# Patient Record
Sex: Female | Born: 1980 | Race: White | Hispanic: No | Marital: Married | Smoking: Former smoker
Health system: Southern US, Community
[De-identification: ages and names within clinical notes are randomized; demographics above are authoritative.]

## PROBLEM LIST (undated history)

## (undated) DIAGNOSIS — G43909 Migraine, unspecified, not intractable, without status migrainosus: Secondary | ICD-10-CM

## (undated) DIAGNOSIS — M359 Systemic involvement of connective tissue, unspecified: Secondary | ICD-10-CM

## (undated) DIAGNOSIS — F3289 Other specified depressive episodes: Secondary | ICD-10-CM

## (undated) DIAGNOSIS — T7840XA Allergy, unspecified, initial encounter: Secondary | ICD-10-CM

## (undated) DIAGNOSIS — H269 Unspecified cataract: Secondary | ICD-10-CM

## (undated) DIAGNOSIS — F419 Anxiety disorder, unspecified: Secondary | ICD-10-CM

## (undated) DIAGNOSIS — M1990 Unspecified osteoarthritis, unspecified site: Secondary | ICD-10-CM

## (undated) DIAGNOSIS — J309 Allergic rhinitis, unspecified: Secondary | ICD-10-CM

## (undated) DIAGNOSIS — M069 Rheumatoid arthritis, unspecified: Secondary | ICD-10-CM

## (undated) DIAGNOSIS — M26609 Unspecified temporomandibular joint disorder, unspecified side: Secondary | ICD-10-CM

## (undated) DIAGNOSIS — J45909 Unspecified asthma, uncomplicated: Secondary | ICD-10-CM

## (undated) DIAGNOSIS — G4733 Obstructive sleep apnea (adult) (pediatric): Secondary | ICD-10-CM

## (undated) DIAGNOSIS — F329 Major depressive disorder, single episode, unspecified: Secondary | ICD-10-CM

## (undated) HISTORY — DX: Allergy, unspecified, initial encounter: T78.40XA

## (undated) HISTORY — DX: Obstructive sleep apnea (adult) (pediatric): G47.33

## (undated) HISTORY — DX: Anxiety disorder, unspecified: F41.9

## (undated) HISTORY — DX: Systemic involvement of connective tissue, unspecified: M35.9

## (undated) HISTORY — DX: Unspecified temporomandibular joint disorder, unspecified side: M26.609

## (undated) HISTORY — DX: Allergic rhinitis, unspecified: J30.9

## (undated) HISTORY — DX: Unspecified asthma, uncomplicated: J45.909

## (undated) HISTORY — DX: Unspecified cataract: H26.9

## (undated) HISTORY — PX: HEMORRHOID SURGERY: SHX153

## (undated) HISTORY — DX: Migraine, unspecified, not intractable, without status migrainosus: G43.909

## (undated) HISTORY — DX: Rheumatoid arthritis, unspecified: M06.9

## (undated) HISTORY — DX: Other specified depressive episodes: F32.89

## (undated) HISTORY — DX: Unspecified osteoarthritis, unspecified site: M19.90

## (undated) HISTORY — DX: Major depressive disorder, single episode, unspecified: F32.9

---
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
## 2008-06-16 ENCOUNTER — Emergency Department (HOSPITAL_COMMUNITY): Admission: EM | Admit: 2008-06-16 | Discharge: 2008-06-16 | Payer: Self-pay | Admitting: Emergency Medicine

## 2008-07-14 ENCOUNTER — Encounter: Admission: RE | Admit: 2008-07-14 | Discharge: 2008-07-14 | Payer: Self-pay | Admitting: Obstetrics and Gynecology

## 2008-07-21 ENCOUNTER — Encounter: Admission: RE | Admit: 2008-07-21 | Discharge: 2008-07-21 | Payer: Self-pay | Admitting: Obstetrics and Gynecology

## 2008-09-10 ENCOUNTER — Ambulatory Visit (HOSPITAL_COMMUNITY): Admission: RE | Admit: 2008-09-10 | Discharge: 2008-09-10 | Payer: Self-pay | Admitting: Rheumatology

## 2008-10-11 ENCOUNTER — Ambulatory Visit: Payer: Self-pay | Admitting: Internal Medicine

## 2008-10-11 DIAGNOSIS — R519 Headache, unspecified: Secondary | ICD-10-CM | POA: Insufficient documentation

## 2008-10-11 DIAGNOSIS — Z87448 Personal history of other diseases of urinary system: Secondary | ICD-10-CM | POA: Insufficient documentation

## 2008-10-11 DIAGNOSIS — B373 Candidiasis of vulva and vagina: Secondary | ICD-10-CM

## 2008-10-11 DIAGNOSIS — J309 Allergic rhinitis, unspecified: Secondary | ICD-10-CM | POA: Insufficient documentation

## 2008-10-11 DIAGNOSIS — B351 Tinea unguium: Secondary | ICD-10-CM

## 2008-10-11 DIAGNOSIS — R51 Headache: Secondary | ICD-10-CM

## 2008-10-11 DIAGNOSIS — M129 Arthropathy, unspecified: Secondary | ICD-10-CM | POA: Insufficient documentation

## 2008-10-11 DIAGNOSIS — G43909 Migraine, unspecified, not intractable, without status migrainosus: Secondary | ICD-10-CM

## 2008-10-11 DIAGNOSIS — M359 Systemic involvement of connective tissue, unspecified: Secondary | ICD-10-CM

## 2008-10-11 DIAGNOSIS — J45909 Unspecified asthma, uncomplicated: Secondary | ICD-10-CM | POA: Insufficient documentation

## 2008-10-11 DIAGNOSIS — F329 Major depressive disorder, single episode, unspecified: Secondary | ICD-10-CM

## 2008-10-22 ENCOUNTER — Ambulatory Visit (HOSPITAL_COMMUNITY): Admission: RE | Admit: 2008-10-22 | Discharge: 2008-10-22 | Payer: Self-pay | Admitting: Rheumatology

## 2008-11-11 ENCOUNTER — Ambulatory Visit: Payer: Self-pay | Admitting: Internal Medicine

## 2008-11-11 DIAGNOSIS — N390 Urinary tract infection, site not specified: Secondary | ICD-10-CM

## 2008-11-11 LAB — CONVERTED CEMR LAB
Bilirubin Urine: NEGATIVE
Nitrite: NEGATIVE
Protein, U semiquant: NEGATIVE
Specific Gravity, Urine: 1.025
pH: 5

## 2008-11-12 ENCOUNTER — Encounter: Payer: Self-pay | Admitting: Internal Medicine

## 2008-11-16 ENCOUNTER — Encounter: Payer: Self-pay | Admitting: Internal Medicine

## 2008-12-03 ENCOUNTER — Encounter: Payer: Self-pay | Admitting: Internal Medicine

## 2008-12-07 ENCOUNTER — Ambulatory Visit: Payer: Self-pay | Admitting: Internal Medicine

## 2008-12-07 LAB — CONVERTED CEMR LAB
Bilirubin Urine: NEGATIVE
Nitrite: NEGATIVE
Protein, U semiquant: NEGATIVE
Specific Gravity, Urine: <1.005
WBC Urine, dipstick: NEGATIVE

## 2008-12-08 ENCOUNTER — Encounter: Payer: Self-pay | Admitting: Internal Medicine

## 2009-01-11 ENCOUNTER — Ambulatory Visit: Payer: Self-pay | Admitting: Internal Medicine

## 2009-01-14 ENCOUNTER — Encounter: Payer: Self-pay | Admitting: Internal Medicine

## 2009-01-26 ENCOUNTER — Encounter: Payer: Self-pay | Admitting: Internal Medicine

## 2009-02-08 ENCOUNTER — Ambulatory Visit: Payer: Self-pay | Admitting: Internal Medicine

## 2009-02-23 ENCOUNTER — Ambulatory Visit: Payer: Self-pay | Admitting: Internal Medicine

## 2009-03-26 ENCOUNTER — Telehealth: Payer: Self-pay | Admitting: Family Medicine

## 2009-03-29 ENCOUNTER — Ambulatory Visit: Payer: Self-pay | Admitting: Internal Medicine

## 2009-04-08 ENCOUNTER — Encounter: Payer: Self-pay | Admitting: Internal Medicine

## 2009-08-11 ENCOUNTER — Encounter: Payer: Self-pay | Admitting: Internal Medicine

## 2009-08-26 ENCOUNTER — Encounter: Payer: Self-pay | Admitting: Internal Medicine

## 2009-08-30 ENCOUNTER — Ambulatory Visit: Payer: Self-pay | Admitting: Internal Medicine

## 2009-08-30 LAB — CONVERTED CEMR LAB
Bilirubin Urine: NEGATIVE
Blood in Urine, dipstick: NEGATIVE
Protein, U semiquant: 30
Specific Gravity, Urine: >=1.03
Urobilinogen, UA: 0.2

## 2010-01-28 IMAGING — CR DG CHEST 2V
2 series · 2 of 2 positions shown · non-contrast
Comparison: None available.

CLINICAL DATA: Pre-procedure examination.  Question tuberculosis.

CHEST - 2 VIEW

[w chest pa]
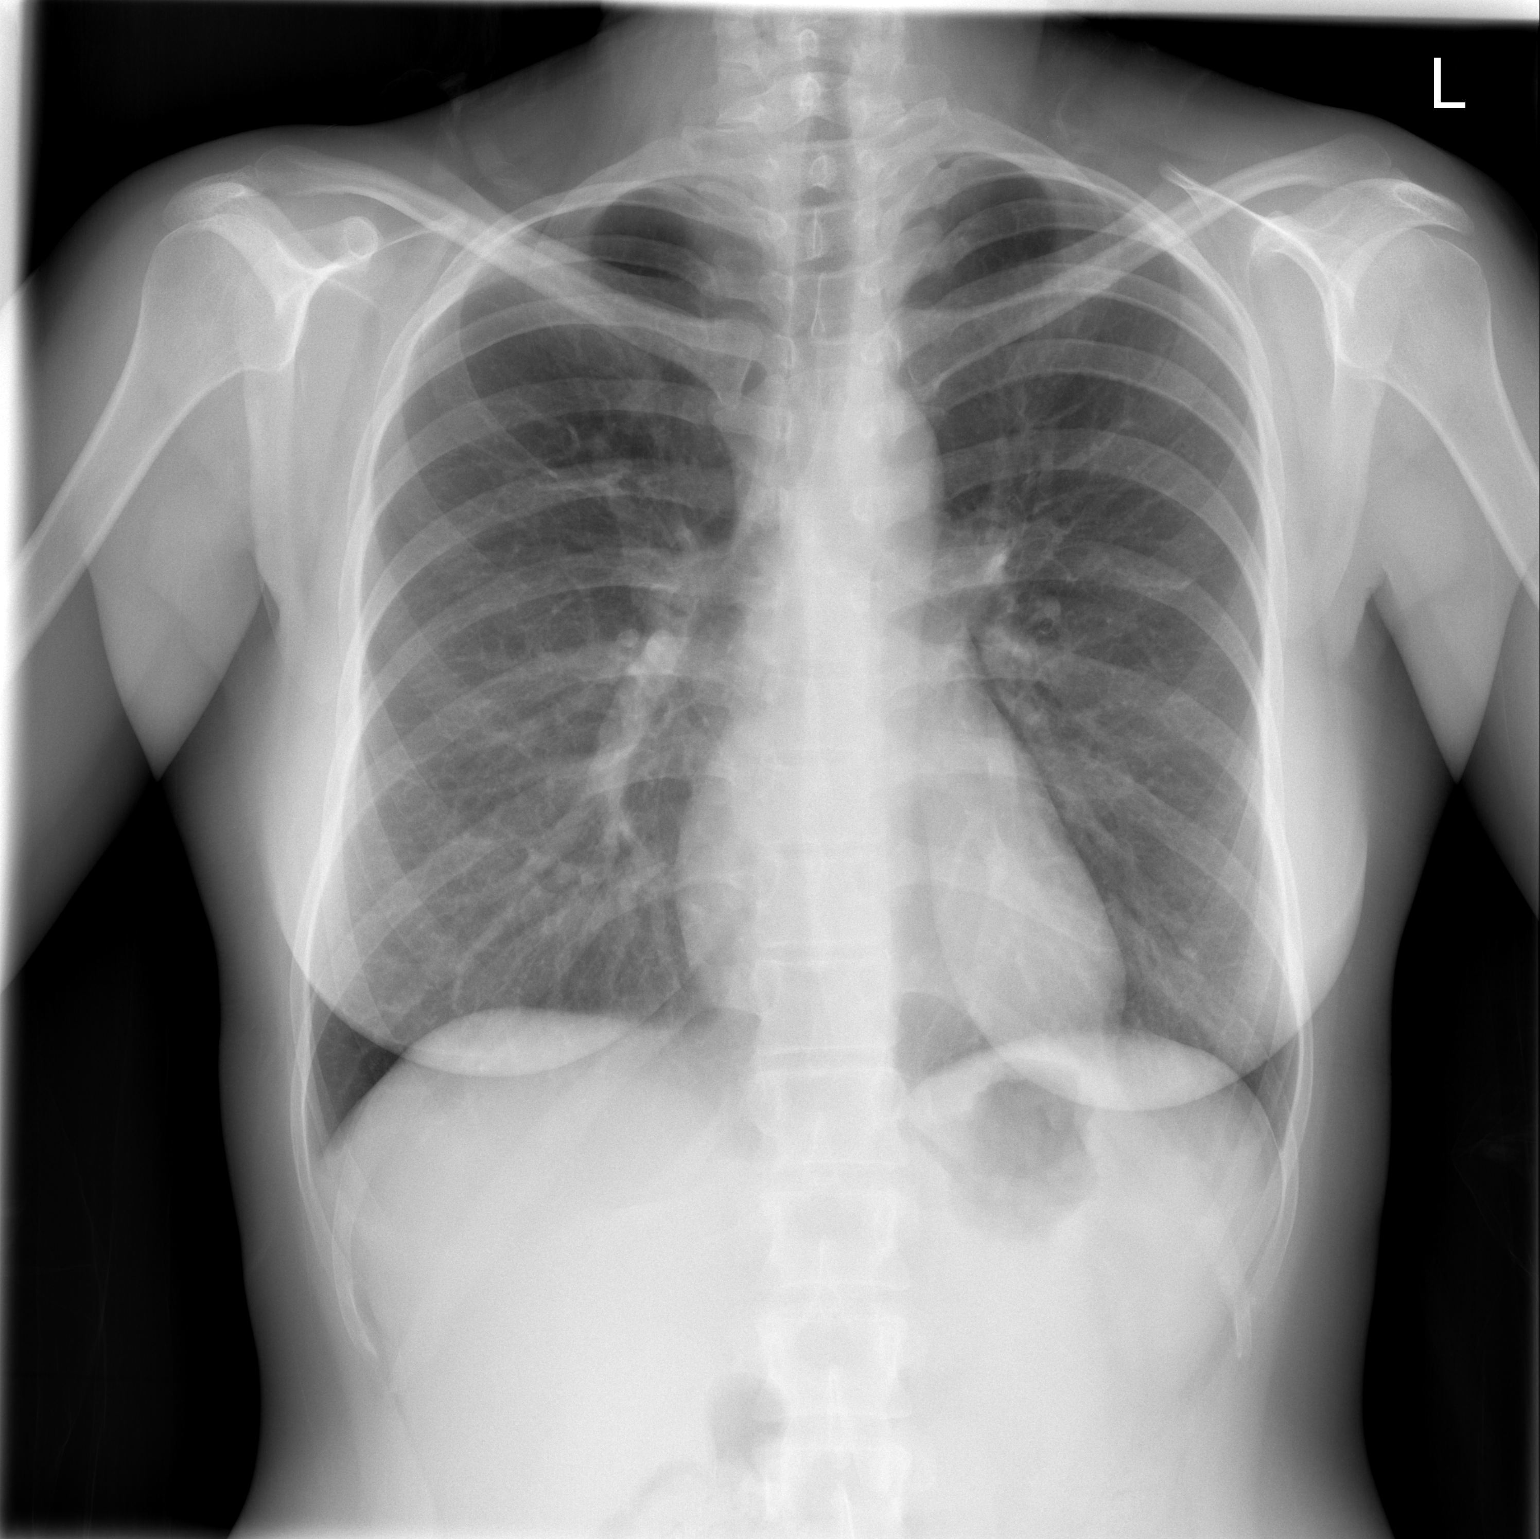

[w chest lat]
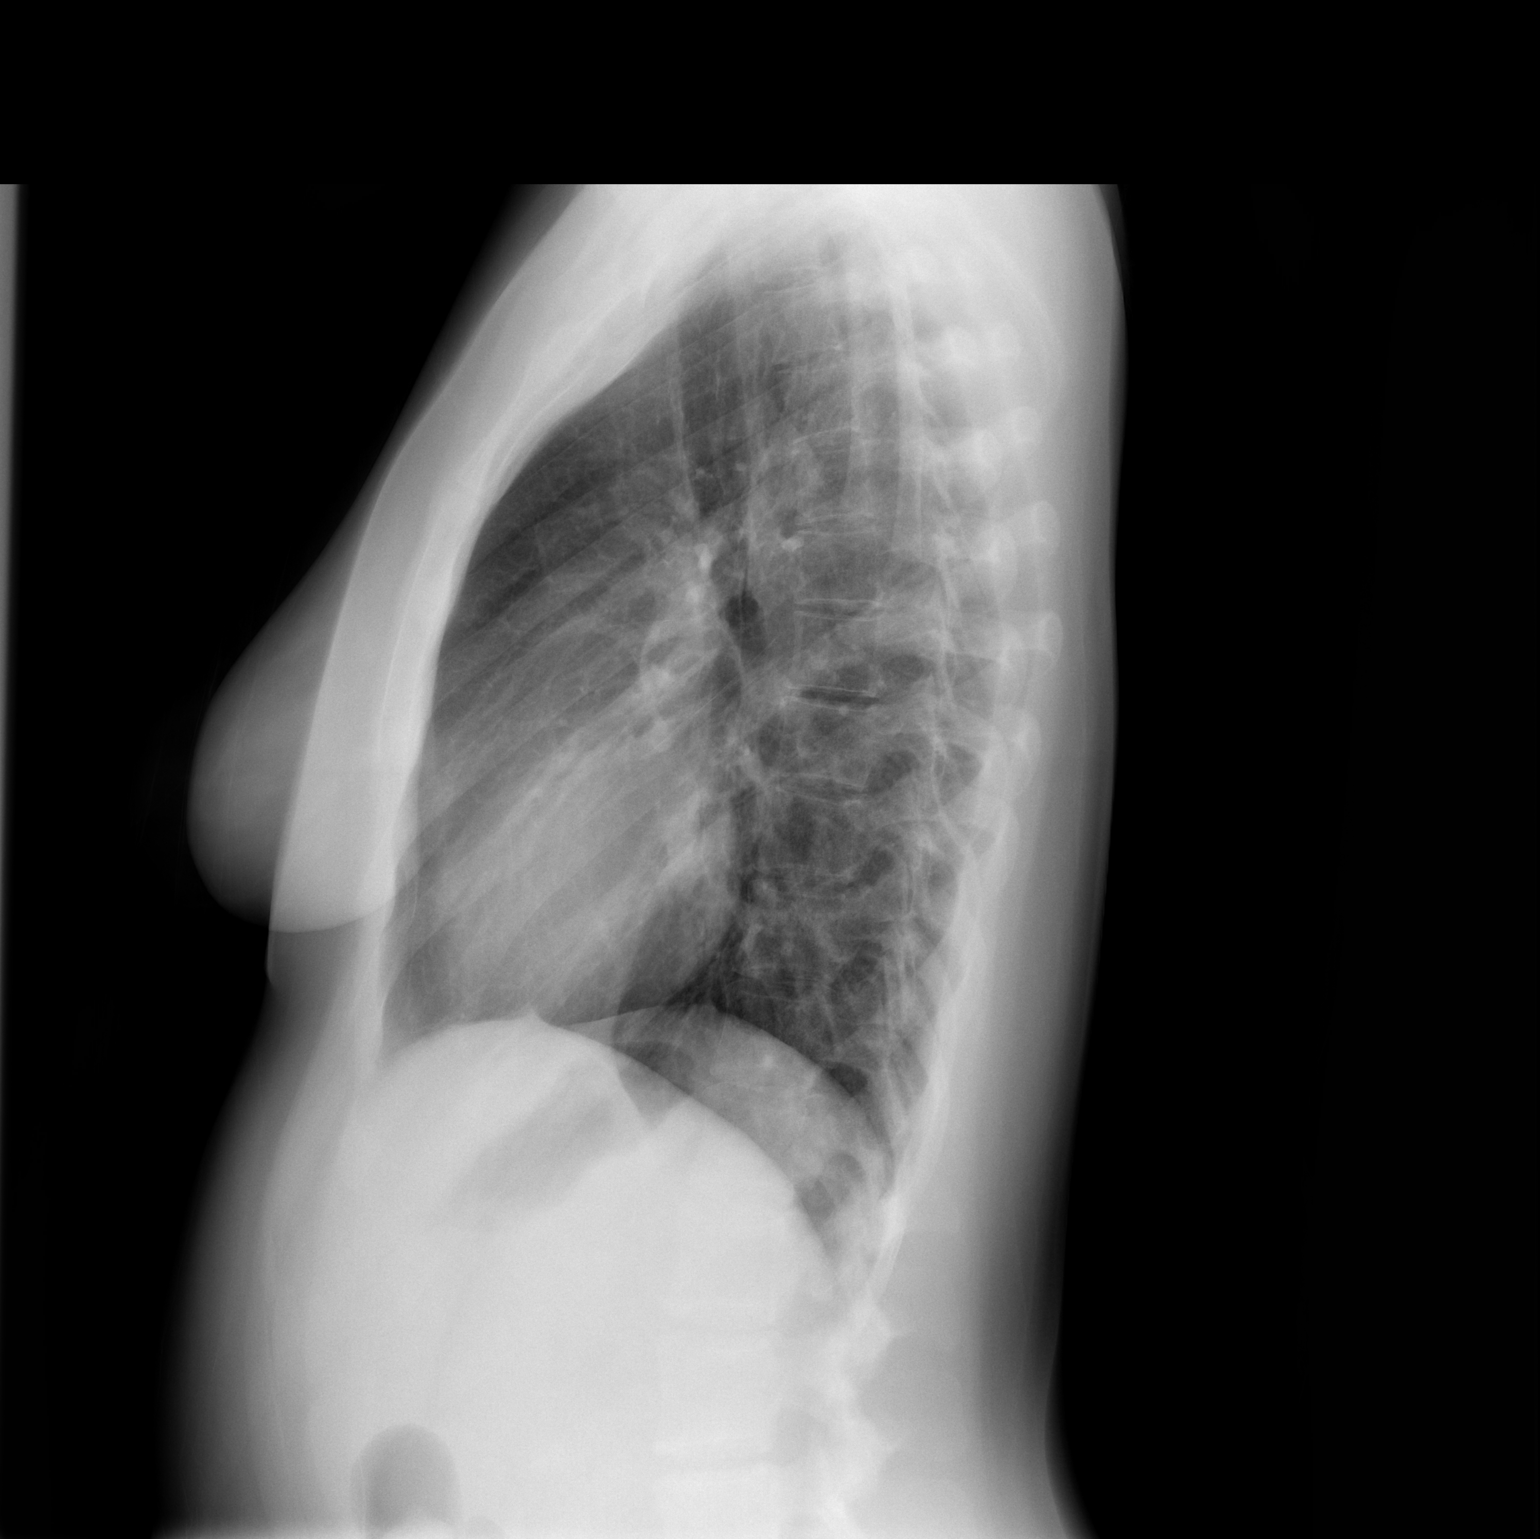

[2 of 2 positions shown; findings below may reference images not displayed]

FINDINGS: The lungs are clear.  Heart size is normal.  There is no
pleural effusion or focal bony abnormality.
IMPRESSION: No acute disease.

## 2010-03-02 ENCOUNTER — Ambulatory Visit: Payer: Self-pay | Admitting: Internal Medicine

## 2010-03-02 DIAGNOSIS — M7660 Achilles tendinitis, unspecified leg: Secondary | ICD-10-CM

## 2010-04-01 ENCOUNTER — Ambulatory Visit
Admission: RE | Admit: 2010-04-01 | Discharge: 2010-04-01 | Payer: Self-pay | Source: Home / Self Care | Attending: Family Medicine | Admitting: Family Medicine

## 2010-04-01 DIAGNOSIS — J019 Acute sinusitis, unspecified: Secondary | ICD-10-CM | POA: Insufficient documentation

## 2010-05-02 NOTE — Letter (Signed)
Summary: [facility]  [facility]   Imported By: Sherian Rein 04/20/2009 10:57:29  _____________________________________________________________________  External Attachment:    Type:   Image     Comment:   External Document

## 2010-05-02 NOTE — Letter (Signed)
Summary: [facility]  [facility]   Imported By: Sherian Rein 09/13/2009 11:02:42  _____________________________________________________________________  External Attachment:    Type:   Image     Comment:   External Document

## 2010-05-02 NOTE — Assessment & Plan Note (Signed)
Summary: DR VL PT--NO PM CLINIC--BACK OF LEG PAIN   --STC   Vital Signs:  Patient profile:   30 year old female Menstrual status:  regular Height:      64 inches Weight:      147.25 pounds BMI:     25.37 O2 Sat:      97 % on Room air Temp:     97.7 degrees F oral Pulse rate:   77 / minute BP sitting:   90 / 60  (left arm) Cuff size:   regular  Vitals Entered By: Zella Ball Ewing CMA (AAMA) (March 02, 2010 2:23 PM)  O2 Flow:  Room air CC: Left leg pain/RE   Primary Care Provider:  Newt Lukes MD  CC:  Left leg pain/RE.  History of Present Illness: here with 10 dyas left calf pain below the knee only, not assoc with knee pain, sweling, trauma or fall though she did have hx of ruptured left baker cyst;  pain now aobut 7-8/10, constant and worse with walking and palpation; She's not sure if swollen, no obivous trauma.  Has hx of raynauds phenomenon (hands red today in fact); but no hx of LE vascular disease.  Did have episode of LBP several days last wk, bilat lower lumbar , seemed to be at the time as the Left calf pain, but now better , but calf pain persists.  No LLE or other extremity numbness, or LE weakness and no falls. Works as Comptroller, but no incresaed standing, walking, climibing or other unusual activity, in fact overall less active for last 4-5 wks.  No skin color change and no rahs.  No prior hx of this.  Does do the elliptical machine at the gym occasionally for excercise.    Problems Prior to Update: 1)  Achilles Tendinitis, Mild  (ICD-726.71) 2)  Uti  (ICD-599.0) 3)  Candidiasis of Vulva and Vagina  (ICD-112.1) 4)  Onychomycosis, Toenails  (ICD-110.1) 5)  Family History Diabetes 1st Degree Relative  (ICD-V18.0) 6)  Autoimmune Disease Not Elsewhere Classified  (ICD-279.49) 7)  Uti's, Hx of  (ICD-V13.00) 8)  Migraine Headache  (ICD-346.90) 9)  Headache  (ICD-784.0) 10)  Arthritis  (ICD-716.90) 11)  Depression  (ICD-311) 12)  Asthma  (ICD-493.90) 13)   Allergic Rhinitis  (ICD-477.9)  Medications Prior to Update: 1)  Plaquenil 200 Mg Tabs (Hydroxychloroquine Sulfate) .... Take 2 By Mouth Qd 2)  Norvasc 2.5 Mg Tabs (Amlodipine Besylate) .... Take 1 By Mouth Qd 3)  Loestrin 24 Fe 1-20 Mg-Mcg Tabs (Norethin Ace-Eth Estrad-Fe) .... Take 1 By Mouth Qd 4)  Zoloft 100 Mg Tabs (Sertraline Hcl) .... Takde 1 By Mouth Qd 5)  Allegra 180 Mg Tabs (Fexofenadine Hcl) .Marland Kitchen.. 1 By Mouth Once Daily 6)  Proair Hfa 108 (90 Base) Mcg/act Aers (Albuterol Sulfate) .... Use Prn 7)  Mobic 15 Mg Tabs (Meloxicam) 8)  Cimizia .Marland Kitchen.. 1 Injection Every 2 Wks  Current Medications (verified): 1)  Plaquenil 200 Mg Tabs (Hydroxychloroquine Sulfate) .... Take 2 By Mouth Qd 2)  Norvasc 2.5 Mg Tabs (Amlodipine Besylate) .... Take 1 By Mouth Qd 3)  Loestrin 24 Fe 1-20 Mg-Mcg Tabs (Norethin Ace-Eth Estrad-Fe) .... Take 1 By Mouth Qd 4)  Zoloft 100 Mg Tabs (Sertraline Hcl) .... Takde 1 By Mouth Qd 5)  Allegra 180 Mg Tabs (Fexofenadine Hcl) .Marland Kitchen.. 1 By Mouth Once Daily 6)  Proair Hfa 108 (90 Base) Mcg/act Aers (Albuterol Sulfate) .... Use Prn 7)  Naproxen 500 Mg Tabs (Naproxen) .Marland KitchenMarland KitchenMarland Kitchen  1po Two Times A Day As Needed 8)  Cimizia .Marland Kitchen.. 1 Injection Every 2 Wks  Allergies (verified): 1)  ! Aspirin 2)  ! 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   Head:  normocephalic and atraumatic.   Eyes:  vision grossly intact, pupils equal, and  pupils round.   Ears:  R ear normal and L ear normal.   Nose:  no external deformity and no nasal discharge.   Mouth:  no gingival abnormalities and pharynx pink and moist.   Neck:  supple and no masses.   Lungs:  normal respiratory effort and normal breath sounds.   Heart:  normal rate and regular rhythm.   Msk:  bilat knees with FROM, NT and no effusion;  no rash or skin change or sweling, has mild tenderness of the most distal calf/prox achilles tendon, with worse pain to flex the ankle Extremities:  no edema, no erythema  Neurologic:  LLE o/w neurovasc intact   Impression & Recommendations:  Problem # 1:  ACHILLES TENDINITIS, MILD (ICD-726.71) mild, left - for nsaid as needed,f/u any worsening s/s, may want to avoid so much ellipitical machine for 2 wks to heal  Complete Medication List: 1)  Plaquenil 200 Mg Tabs (Hydroxychloroquine sulfate) .... Take 2 by mouth qd 2)  Norvasc 2.5 Mg Tabs (Amlodipine besylate) .... Take 1 by mouth qd 3)  Loestrin 24 Fe 1-20 Mg-mcg Tabs (Norethin ace-eth estrad-fe) .... Take 1 by mouth qd 4)  Zoloft 100 Mg Tabs (Sertraline hcl) .... Takde 1 by mouth qd 5)  Allegra 180 Mg Tabs (Fexofenadine hcl) .Marland Kitchen.. 1 by mouth once daily 6)  Proair Hfa 108 (90 Base) Mcg/act Aers (Albuterol sulfate) .... Use prn 7)  Naproxen 500 Mg Tabs (Naproxen) .Marland Kitchen.. 1po two times a day as needed 8)  Cimizia  .Marland Kitchen.. 1 injection every 2 wks  Patient Instructions: 1)  Please take all new medications as prescribed 2)  Continue all previous medications as before this visit  3)  Please schedule an appointment with your primary doctor as needed Prescriptions: NAPROXEN 500 MG TABS (NAPROXEN) 1po two times a day as needed  #60 x 1   Entered and Authorized by:   Corwin Levins MD   Signed by:   Corwin Levins MD on 03/02/2010   Method used:   Print then Give to Patient   RxID:   [id number]    Orders Added: 1)  Est. Patient Level III [30865]

## 2010-05-02 NOTE — Assessment & Plan Note (Signed)
Summary: dr vl pt/no clinic -frequency w/pain--stc   Vital Signs:  Patient profile:   30 year old female Menstrual status:  regular LMP:     08/25/2009 Height:      65 inches Weight:      147 pounds BMI:     24.55 O2 Sat:      98 % on Room air Temp:     97.5 degrees F oral Pulse rate:   74 / minute Pulse rhythm:   regular Resp:     16 per minute BP sitting:   118 / 74  (left arm) Cuff size:   large  Vitals Entered By: Rock Nephew CMA (Aug 30, 2009 10:24 AM)  O2 Flow:  Room air  Primary Care Provider:  Newt Lukes MD  CC:  evaluation of UTI's.  History of Present Illness:       This is a 30 year old female who presents for evaluation of UTI's.  The onset of the problem began 2 days ago.  The severity is described as mild.  The patient presents with [facility]-2`GU-brief.  The patient is not currently sexually active.    Preventive Screening-Counseling & Management  Alcohol-Tobacco     Alcohol drinks/day: 0     Smoking Status: quit  Hep-HIV-STD-Contraception     Hepatitis Risk: no risk noted     HIV Risk: no risk noted     STD Risk: no risk noted      Sexual History:  currently monogamous.        Drug Use:  never.        Blood Transfusions:  no.    Medications Prior to Update: 1)  Plaquenil 200 Mg Tabs (Hydroxychloroquine Sulfate) .... Take 2 By Mouth Qd 2)  Norvasc 2.5 Mg Tabs (Amlodipine Besylate) .... Take 1 By Mouth Qd 3)  Loestrin 24 Fe 1-20 Mg-Mcg Tabs (Norethin Ace-Eth Estrad-Fe) .... Take 1 By Mouth Qd 4)  Zoloft 100 Mg Tabs (Sertraline Hcl) .... Takde 1 By Mouth Qd 5)  Allegra 180 Mg Tabs (Fexofenadine Hcl) .Marland Kitchen.. 1 By Mouth Once Daily 6)  Lorazepam 0.5 Mg Tabs (Lorazepam) .... Take 2 By Mouth Once Daily Prn 7)  Proair Hfa 108 (90 Base) Mcg/act Aers (Albuterol Sulfate) .... Use Prn 8)  Humira 20 Mg/0.66ml Kit (Adalimumab) .... Take 1 Injection Q 2 Weeks  Current Medications (verified): 1)  Plaquenil 200 Mg Tabs (Hydroxychloroquine Sulfate) ....  Take 2 By Mouth Qd 2)  Norvasc 2.5 Mg Tabs (Amlodipine Besylate) .... Take 1 By Mouth Qd 3)  Loestrin 24 Fe 1-20 Mg-Mcg Tabs (Norethin Ace-Eth Estrad-Fe) .... Take 1 By Mouth Qd 4)  Zoloft 100 Mg Tabs (Sertraline Hcl) .... Takde 1 By Mouth Qd 5)  Allegra 180 Mg Tabs (Fexofenadine Hcl) .Marland Kitchen.. 1 By Mouth Once Daily 6)  Proair Hfa 108 (90 Base) Mcg/act Aers (Albuterol Sulfate) .... Use Prn 7)  Mobic 15 Mg Tabs (Meloxicam) 8)  Cimizia .Marland Kitchen.. 1 Injection Every 2 Wks 9)  Ciprofloxacin Hcl 250 Mg Tabs (Ciprofloxacin Hcl) .... One By Mouth Two Times A Day For 5 Days  Allergies (verified): 1)  ! Aspirin 2)  ! Sulfa  Past History:  Past Medical History: Reviewed history from 03/29/2009 and no changes required. Allergic rhinitis Asthma Depression -[place] autoimmune dz - inflam RA -   Past Surgical History: Reviewed history from 10/11/2008 and no changes required. Hemorrhoidectomy (1983)  Family History: Reviewed history from 10/11/2008 and no changes required. Family History of Arthritis (grandparents) Family  History High cholesterol (grandparent & parents) Family History Hypertension (gramparents & parent) Stroke (grandparent) Family History Diabetes 1st degree relative (Other blood relative) Non-Hodgkins Lymphoma (other blood relative)  Social History: Reviewed history from 10/11/2008 and no changes required. 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   GU:  Complains of dysuria, urinary frequency, and urinary hesitancy; denies abnormal vaginal bleeding,  discharge, genital sores, hematuria, incontinence, and nocturia.  Physical Exam  General:  alert, well-developed, well-nourished, well-hydrated, appropriate dress, normal appearance, and healthy-appearing.   Head:  normocephalic, atraumatic, no abnormalities observed, and no abnormalities palpated.   Mouth:  Oral mucosa and oropharynx without lesions or exudates.  Teeth in good repair. Neck:  supple, full ROM, no masses, no thyromegaly; no thyroid nodules or tenderness. no JVD or carotid bruits.   Lungs:  normal respiratory effort, no intercostal retractions, no accessory muscle use, normal breath sounds, no dullness, and no fremitus.   Heart:  normal rate, regular rhythm, no murmur, and no gallop.   Abdomen:  soft, non-tender, normal bowel sounds, no distention, no masses, no guarding, no rigidity, no rebound tenderness, no abdominal hernia, no inguinal hernia, no hepatomegaly, and no splenomegaly.  No CVAT. Msk:  No deformity or scoliosis noted of thoracic or lumbar spine.   Pulses:  R and L carotid,radial,femoral,dorsalis pedis and posterior tibial pulses are full and equal bilaterally Extremities:  No clubbing, cyanosis, edema, or deformity noted with normal full range of motion of all joints.   Neurologic:  No cranial nerve deficits noted. Station and gait are normal. Plantar reflexes are down-going bilaterally. DTRs are symmetrical throughout. Sensory, motor and coordinative functions appear intact. Skin:  Intact without suspicious lesions or rashes Cervical Nodes:  no anterior cervical adenopathy and no posterior cervical adenopathy.   Psych:  Cognition and judgment appear intact. Alert and cooperative with normal attention span and concentration. No apparent delusions, illusions, hallucinations   Impression & Recommendations:  Problem # 1:  UTI (ICD-599.0) Assessment Deteriorated  Her updated medication list for this problem includes:    Ciprofloxacin Hcl 250 Mg Tabs (Ciprofloxacin  hcl) ..... One by mouth two times a day for 5 days  Orders: UA Dipstick w/o Micro (manual) (46962)  Encouraged to push clear liquids, get enough rest, and take acetaminophen as needed. To be seen in 10 days if no improvement, sooner if worse.  Complete Medication List: 1)  Plaquenil 200 Mg Tabs (Hydroxychloroquine sulfate) .... Take 2 by mouth qd 2)  Norvasc 2.5 Mg Tabs (Amlodipine besylate) .... Take 1 by mouth qd 3)  Loestrin 24 Fe 1-20 Mg-mcg Tabs (Norethin ace-eth estrad-fe) .... Take 1 by mouth qd 4)  Zoloft 100 Mg Tabs (Sertraline hcl) .... Takde 1 by mouth qd 5)  Allegra 180 Mg Tabs (Fexofenadine hcl) .Marland Kitchen.. 1 by mouth once daily 6)  Proair Hfa 108 (90 Base) Mcg/act Aers (Albuterol sulfate) .... Use prn 7)  Mobic 15 Mg Tabs (Meloxicam) 8)  Cimizia  .Marland Kitchen.. 1 injection every 2 wks 9)  Ciprofloxacin Hcl 250 Mg Tabs (Ciprofloxacin hcl) .... One by mouth two times a day for 5  days  Patient Instructions: 1)  Take your antibiotic as prescribed until ALL of it is gone, but stop if you develop a rash or swelling and contact our office as soon as possible. Prescriptions: CIPROFLOXACIN HCL 250 MG TABS (CIPROFLOXACIN HCL) One by mouth two times a day for 5 days  #10 x 1   Entered and Authorized by:   Etta Grandchild MD   Signed by:   Etta Grandchild MD on 08/30/2009   Method used:   Electronically to        [facility] [address] (retail)       [address]       [place], [place]  [address]       Ph: [phone]       Fax: [phone]   RxID:   [id number]   Laboratory Results   Urine Tests  Date/Time Received: Rock Nephew CMA  Aug 30, 2009 10:38 AM   Routine Urinalysis   Color: yellow Glucose: negative   (Normal Range: Negative) Bilirubin: negative   (Normal Range: Negative) Ketone: moderate (40)   (Normal Range: Negative) Spec. Gravity: >=1.030   (Normal Range: 1.003-1.035) Blood: negative   (Normal Range: Negative) pH: 5.0   (Normal Range:  5.0-8.0) Protein: 30   (Normal Range: Negative) Urobilinogen: 0.2   (Normal Range: 0-1) Nitrite: negative   (Normal Range: Negative) Leukocyte Esterace: large   (Normal Range: Negative)

## 2010-05-02 NOTE — Letter (Signed)
Summary: Blima Ledger OD  Blima Ledger OD   Imported By: Sherian Rein 08/18/2009 08:24:23  _____________________________________________________________________  External Attachment:    Type:   Image     Comment:   External Document

## 2010-05-04 NOTE — Assessment & Plan Note (Signed)
Summary: SINUS INFECTION/DLO   Vital Signs:  Patient profile:   30 year old female Menstrual status:  regular Height:      64 inches (162.56 cm) Weight:      152.75 pounds (69.43 kg) BMI:     26.31 O2 Sat:      88 % on Room air Temp:     98.5 degrees F (36.94 degrees C) oral Pulse rate:   118 / minute BP sitting:   108 / 64  (left arm) Cuff size:   regular  Vitals Entered By: Brenton Grills CMA Duncan Dull) (April 01, 2010 12:11 PM)  O2 Flow:  Room air CC: sinus infection (drainage, congestion, yellowish mucus ) x 6 days/aj, URI symptoms Is Patient Diabetic? No Comments pt is no longer taking Naproxen   History of Present Illness:       This is a 30 year old woman who presents with URI symptoms.  The symptoms began 1 week ago.  Pt is only taking tylenol.   + B/L sinus congestion .  The patient complains of nasal congestion, purulent nasal discharge, productive cough, earache, and sick contacts.  The patient denies fever, low-grade fever (<100.5 degrees), fever of 100.5-103 degrees, fever of 103.1-104 degrees, fever to >104 degrees, stiff neck, dyspnea, wheezing, rash, vomiting, diarrhea, use of an antipyretic, and response to antipyretic.  The patient also reports headache.  The patient denies itchy watery eyes, itchy throat, sneezing, seasonal symptoms, response to antihistamine, muscle aches, and severe fatigue.  The patient denies the following risk factors for Strep sinusitis: unilateral facial pain, unilateral nasal discharge, poor response to decongestant, double sickening, tooth pain, Strep exposure, tender adenopathy, and absence of cough.    Problems Prior to Update: 1)  Achilles Tendinitis, Mild  (ICD-726.71) 2)  Uti  (ICD-599.0) 3)  Candidiasis of Vulva and Vagina  (ICD-112.1) 4)  Onychomycosis, Toenails  (ICD-110.1) 5)  Family History Diabetes 1st Degree Relative  (ICD-V18.0) 6)  Autoimmune Disease Not Elsewhere Classified  (ICD-279.49) 7)  Uti's, Hx of  (ICD-V13.00) 8)   Migraine Headache  (ICD-346.90) 9)  Headache  (ICD-784.0) 10)  Arthritis  (ICD-716.90) 11)  Depression  (ICD-311) 12)  Asthma  (ICD-493.90) 13)  Allergic Rhinitis  (ICD-477.9)  Medications Prior to Update: 1)  Plaquenil 200 Mg Tabs (Hydroxychloroquine Sulfate) .... Take 2 By Mouth Qd 2)  Norvasc 2.5 Mg Tabs (Amlodipine Besylate) .... Take 1 By Mouth Qd 3)  Loestrin 24 Fe 1-20 Mg-Mcg Tabs (Norethin Ace-Eth Estrad-Fe) .... Take 1 By Mouth Qd 4)  Zoloft 100 Mg Tabs (Sertraline Hcl) .... Takde 1 By Mouth Qd 5)  Allegra 180 Mg Tabs (Fexofenadine Hcl) .Marland Kitchen.. 1 By Mouth Once Daily 6)  Proair Hfa 108 (90 Base) Mcg/act Aers (Albuterol Sulfate) .... Use Prn 7)  Naproxen 500 Mg Tabs (Naproxen) .Marland Kitchen.. 1po Two Times A Day As Needed 8)  Cimizia .Marland Kitchen.. 1 Injection Every 2 Wks  Current Medications (verified): 1)  Plaquenil 200 Mg Tabs (Hydroxychloroquine Sulfate) .... Take 2 By Mouth Qd 2)  Norvasc 2.5 Mg Tabs (Amlodipine Besylate) .... Take 1 By Mouth Qd 3)  Loestrin 24 Fe 1-20 Mg-Mcg Tabs (Norethin Ace-Eth Estrad-Fe) .... Take 1 By Mouth Qd 4)  Zoloft 100 Mg Tabs (Sertraline Hcl) .... Takde 1 By Mouth Qd 5)  Allegra 180 Mg Tabs (Fexofenadine Hcl) .Marland Kitchen.. 1 By Mouth Once Daily 6)  Proair Hfa 108 (90 Base) Mcg/act Aers (Albuterol Sulfate) .... Use Prn 7)  Naproxen 500 Mg Tabs (Naproxen) .Marland Kitchen.. 1po Two  Times A Day As Needed 8)  Cimizia .Marland Kitchen.. 1 Injection Every 2 Wks 9)  Meloxicam 15 Mg Tabs (Meloxicam) .Marland Kitchen.. 1 Tablet By Mouth Once Daily  Allergies (verified): 1)  ! Aspirin 2)  ! Sulfa  Past History:  Past Medical History: Last updated: 03/29/2009 Allergic rhinitis Asthma Depression -Nolen Mu autoimmune dz - inflam RA -   Past Surgical History: Last updated: 10/11/2008 Hemorrhoidectomy (1983)  Family History: Last updated: 10/11/2008 Family History of Arthritis (grandparents) Family History High cholesterol (grandparent & parents) Family History Hypertension (gramparents & parent) Stroke  (grandparent) Family History Diabetes 1st degree relative (Other blood relative) Non-Hodgkins Lymphoma (other blood relative)  Social History: Last updated: 10/11/2008 Former Smoker occ social EtOH master's degree librarian @ [employer] married and lives with spouse, no kids/pregnancies  Risk Factors: Alcohol Use: 0 (08/30/2009)  Risk Factors: Smoking Status: quit (08/30/2009)  Family History: Reviewed history from 10/11/2008 and no changes required. Family History of Arthritis (grandparents) Family History High cholesterol (grandparent & parents) Family History Hypertension (gramparents & parent) Stroke (grandparent) Family History Diabetes 1st degree relative (Other blood relative) Non-Hodgkins Lymphoma (other blood relative)  Social History: Reviewed history from 10/11/2008 and no changes required. 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  Otoscopic examination reveals clear canals, tympanic membranes are intact bilaterally without bulging, retraction, inflammation or discharge. Hearing is grossly normal bilaterally. Nose:  no external deformity, L frontal sinus tenderness, L maxillary sinus tenderness, R frontal sinus tenderness, and R maxillary sinus tenderness.   Mouth:  Oral mucosa and oropharynx without lesions or exudates.  Teeth in good repair. Neck:  No deformities, masses, or tenderness noted. Lungs:  Normal respiratory effort, chest expands symmetrically. Lungs are clear to auscultation, no crackles or wheezes. Heart:  Normal rate and regular rhythm. S1 and S2 normal without gallop, murmur, click, rub or other extra sounds.   Impression &  Recommendations:  Problem # 1:  SINUSITIS - ACUTE-NOS (ICD-461.9)  Her updated medication list for this problem includes:    Flonase 50 Mcg/act Susp (Fluticasone propionate) .Marland Kitchen... 2 sprays each nostril once daily    Ceftin 500 Mg Tabs (Cefuroxime axetil) .Marland Kitchen... 1 by mouth two times a day    pred taper  Instructed on treatment. Call if symptoms persist or worsen.   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 Take 2 by mouth qd 2)  Norvasc 2.5 Mg Tabs (Amlodipine besylate) .... Take 1 by mouth qd 3)  Loestrin 24 Fe 1-20 Mg-mcg Tabs (Norethin ace-eth estrad-fe) .... Take 1 by mouth qd 4)  Zoloft 100 Mg Tabs (Sertraline hcl) .... Takde 1 by mouth qd 5)  Allegra 180 Mg Tabs (Fexofenadine hcl) .Marland Kitchen.. 1 by mouth once daily 6)  Proair Hfa 108 (90 Base) Mcg/act Aers (Albuterol sulfate) .... Use prn 7)  Naproxen 500 Mg Tabs (Naproxen) .Marland Kitchen.. 1po two times a day as needed 8)  Cimizia  .Marland Kitchen.. 1 injection every 2 wks 9)  Meloxicam 15 Mg Tabs (Meloxicam) .Marland Kitchen.. 1 tablet by mouth once daily 10)  Flonase 50 Mcg/act Susp (Fluticasone propionate) .... 2 sprays each nostril once daily 11)  Ceftin 500 Mg Tabs (Cefuroxime axetil) .Marland Kitchen.. 1 by mouth two times a day 12)  Fluconazole 150 Mg Tabs (Fluconazole) .Marland Kitchen.. 1 by mouth once daily x1  , may repeat in 3 days as needed 13)  Prednisone 10 Mg Tabs (Prednisone) .... 3 by mouth once daily for 3 days then 2 by mouth once daily for 3 days then 1 by mouth once daily for 3 days Prescriptions: PREDNISONE 10 MG TABS (PREDNISONE) 3 by mouth once daily for 3 days then 2 by mouth once daily for 3 days then 1 by mouth once daily for 3 days  #18 x 0   Entered and Authorized by:   Loreen Freud DO   Signed by:   Loreen Freud DO on 04/01/2010   Method used:   Electronically to        [facility]       [address]       [place], [place]  [address]       Ph: [phone]       Fax: [phone]   RxID:   [id number] FLUCONAZOLE 150 MG TABS (FLUCONAZOLE) 1 by mouth once daily x1  , May repeat in 3 days as needed  #2 x 0   Entered and Authorized by:   Loreen Freud DO   Signed by:   Loreen Freud DO on 04/01/2010   Method used:   Electronically to        [facility]       [address]       [place], [place]  [address]       Ph: [phone]       Fax: [phone]   RxID:   [id number] CEFTIN 500 MG TABS (CEFUROXIME AXETIL) 1 by mouth two times a day  #20 x 0   Entered and Authorized by:   Loreen Freud DO   Signed by:   Loreen Freud DO on 04/01/2010   Method used:   Electronically to        [facility]       [address]       [place], [place]  [address]       Ph: [phone]       Fax: [phone]   RxID:   [id number] FLONASE 50 MCG/ACT SUSP (FLUTICASONE PROPIONATE) 2 sprays each nostril once daily  #1 x 2   Entered and Authorized by:   Loreen Freud DO   Signed by:   Loreen Freud DO on 04/01/2010   Method used:   Electronically to        [facility]       [address]       [place], [place]  [address]       Ph: [phone]       Fax: [phone]   RxID:   [id number]    Orders Added: 1)  Est. Patient Level III [75102]

## 2010-08-08 ENCOUNTER — Encounter: Payer: Self-pay | Admitting: Internal Medicine

## 2010-11-27 ENCOUNTER — Ambulatory Visit (INDEPENDENT_AMBULATORY_CARE_PROVIDER_SITE_OTHER): Payer: BC Managed Care – PPO | Admitting: Internal Medicine

## 2010-11-27 ENCOUNTER — Encounter: Payer: Self-pay | Admitting: Internal Medicine

## 2010-11-27 VITALS — BP 100/68 | HR 93 | Temp 98.4°F | Ht 64.0 in

## 2010-11-27 DIAGNOSIS — W57XXXA Bitten or stung by nonvenomous insect and other nonvenomous arthropods, initial encounter: Secondary | ICD-10-CM

## 2010-11-27 DIAGNOSIS — T148 Other injury of unspecified body region: Secondary | ICD-10-CM

## 2010-11-27 DIAGNOSIS — T148XXA Other injury of unspecified body region, initial encounter: Secondary | ICD-10-CM

## 2010-11-27 DIAGNOSIS — M069 Rheumatoid arthritis, unspecified: Secondary | ICD-10-CM

## 2010-11-27 DIAGNOSIS — L03119 Cellulitis of unspecified part of limb: Secondary | ICD-10-CM

## 2010-11-27 DIAGNOSIS — L02419 Cutaneous abscess of limb, unspecified: Secondary | ICD-10-CM

## 2010-11-27 MED ORDER — TRIAMCINOLONE ACETONIDE 0.1 % EX OINT: TOPICAL_OINTMENT | Freq: Two times a day (BID) | CUTANEOUS | Status: DC | PRN

## 2010-11-27 MED ORDER — DOXYCYCLINE HYCLATE 100 MG PO TABS: 100.0000 mg | ORAL_TABLET | Freq: Two times a day (BID) | ORAL | Status: DC

## 2010-11-27 NOTE — Progress Notes (Signed)
  Subjective:    Patient ID: Joann Harrison, female    DOB: July 21, 1980, 30 y.o.   MRN: [id number]  HPI complains of insect bites to left thigh Onset 4 days ago - ?spider, denies any outdoor exposure Begun as "blistered" area, progressed to flat red skin associated with intense itch and tenderness No other rash, no fever or flare of arthralgias  Past Medical History  Diagnosis Date  . ALLERGIC RHINITIS   . ASTHMA   . DEPRESSION   . MIGRAINE HEADACHE   . 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 Rheumatoid arthritis     neg CCP, +RF - follows with Dr. Roberts Gaudy in [place]    Review of Systems  Eyes: Negative for photophobia.  Cardiovascular: Negative for leg swelling.  Musculoskeletal: Negative for joint swelling.  Neurological: Negative for headaches.       Objective:   Physical Exam BP 100/68  Pulse 93  Temp(Src) 98.4 F (36.9 C) (Oral)  Ht 5\' 4"  (1.626 m)  SpO2 97% Constitutional: She appears well-developed and well-nourished. No distress.  Neck: Normal range of motion. Neck supple. No JVD present. No thyromegaly present.  Cardiovascular: Normal rate, regular rhythm and normal heart sounds.  No murmur heard. No BLE edema. Pulmonary/Chest: Effort normal and breath sounds normal. No respiratory distress. She has no wheezes. Skin: L medial thigh with 4cm diameter of violaceous skin discoloration/petichea - 4 tiny sites of insect bites within affected rash - no vesicles, blistering or fluctuance - minimally abnormal tenderness, slightly increased warmth - remaining skin is warm and dry. No diffuse rash noted. No erythema.  Psychiatric: She has a normal mood and affect. Her behavior is normal. Judgment and thought content normal.   No results found for this basename: WBC, HGB, HCT, PLT, CHOL, TRIG, HDL, LDLDIRECT, ALT, AST, NA, K, CL, CREATININE, BUN, CO2, TSH, PSA, INR, GLUF, HGBA1C, MICROALBUR         Assessment & Plan:  Cellulitis  L thigh - doxy and topical steroids for itch - erx done; consider systemic steroid for allergic reaction component if unimproved with empiric antibiotics but given immunomodulating tx for RA, feel important to cover for infection without systemic steroids at this time

## 2010-11-27 NOTE — Patient Instructions (Signed)
It was good to see you today. We have reviewed your prior records including labs and tests today Keep inflamed area on thigh covered to avoid irritation and apply warm compress 3x/day as discussed Doxycycline antibiotics and topical steroids for itch symptoms - Your prescription(s) have been submitted to your pharmacy. Please take as directed and contact our office if you believe you are having problem(s) with the medication(s).

## 2010-12-11 ENCOUNTER — Encounter: Payer: Self-pay | Admitting: Internal Medicine

## 2010-12-11 ENCOUNTER — Ambulatory Visit (INDEPENDENT_AMBULATORY_CARE_PROVIDER_SITE_OTHER): Payer: BC Managed Care – PPO | Admitting: Internal Medicine

## 2010-12-11 VITALS — BP 110/72 | HR 74 | Temp 98.6°F | Ht 64.0 in

## 2010-12-11 DIAGNOSIS — W57XXXA Bitten or stung by nonvenomous insect and other nonvenomous arthropods, initial encounter: Secondary | ICD-10-CM

## 2010-12-11 DIAGNOSIS — M069 Rheumatoid arthritis, unspecified: Secondary | ICD-10-CM

## 2010-12-11 DIAGNOSIS — M359 Systemic involvement of connective tissue, unspecified: Secondary | ICD-10-CM

## 2010-12-11 NOTE — Progress Notes (Signed)
  Subjective:    Patient ID: Joann Harrison, female    DOB: 1980-09-21, 30 y.o.   MRN: [id number]  HPI  follow up on insect bites to left thigh Completed doxy - No other rash, no fever or flare of arthralgias Plans to resume RA meds if infection resolved  Past Medical History  Diagnosis Date  . ALLERGIC RHINITIS   . ASTHMA   . DEPRESSION   . MIGRAINE HEADACHE   . 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 Rheumatoid arthritis     neg CCP, +RF - follows with Dr. Roberts Gaudy in [place]    Review of Systems  Eyes: Negative for photophobia.  Cardiovascular: Negative for leg swelling.  Musculoskeletal: Negative for joint swelling.  Neurological: Negative for headaches.       Objective:   Physical Exam  BP 110/72  Pulse 74  Temp(Src) 98.6 F (37 C) (Oral)  Ht 5\' 4"  (1.626 m)  SpO2 98% Constitutional: She appears well-developed and well-nourished. No distress.  Cardiovascular: Normal rate, regular rhythm and normal heart sounds.  No murmur heard. No BLE edema. Pulmonary/Chest: Effort normal and breath sounds normal. No respiratory distress. She has no wheezes. Skin: L medial thigh with resolved skin discoloration/petichea - no vesicles, blistering or fluctuance but adhesive contact dermatitis changes at edge of prior bandaging - no tenderness or increased warmth - remaining skin is warm and dry. No diffuse rash noted. No erythema.  Psychiatric: She has a normal mood and affect. Her behavior is normal. Judgment and thought content normal.   No results found for this basename: WBC,  HGB,  HCT,  PLT,  CHOL,  TRIG,  HDL,  LDLDIRECT,  ALT,  AST,  NA,  K,  CL,  CREATININE,  BUN,  CO2,  TSH,  PSA,  INR,  GLUF,  HGBA1C,  MICROALBUR       Assessment & Plan:  Cellulitis  L thigh - resolved after doxy and topical steroids for itch symptoms - ok to resume immunomodulating tx for RA

## 2010-12-11 NOTE — Patient Instructions (Signed)
It was good to see you today. Infection resolved - ok to resume RA meds Use steroid cream alternating with moisturizing lotion to tape irritated skin

## 2011-01-27 ENCOUNTER — Encounter: Payer: Self-pay | Admitting: Family Medicine

## 2011-01-27 ENCOUNTER — Ambulatory Visit (INDEPENDENT_AMBULATORY_CARE_PROVIDER_SITE_OTHER): Payer: BC Managed Care – PPO | Admitting: Family Medicine

## 2011-01-27 DIAGNOSIS — L03019 Cellulitis of unspecified finger: Secondary | ICD-10-CM

## 2011-01-27 MED ORDER — DOXYCYCLINE HYCLATE 100 MG PO TABS: 100.0000 mg | ORAL_TABLET | Freq: Two times a day (BID) | ORAL | Status: AC

## 2011-01-27 NOTE — Progress Notes (Signed)
L 5th finger. Pulled a hangnail; clipped it last night.  Lateral side of distal nail is red and tender. On plaquenil.  No FCNAV.    Meds, vitals, and allergies reviewed.   ROS: See HPI.  Otherwise, noncontributory.  nad rrr ctab L 5th finger with distal/medial mild erythema but no fluctuant mass.

## 2011-01-27 NOTE — Assessment & Plan Note (Signed)
Looks irritated but not infected.  Hold abx and routine meds. Local/topical tx and f/u with MD is worse (and start abx).  She agrees.

## 2011-01-27 NOTE — Patient Instructions (Signed)
I would hold onto the doxycycline rx.  Soak your finger in warm salt water several times a day and then cover with bandaid, neosporin. If you have more redness on the finger, start the antibiotics and notify your MD. If you feel better tomorrow, then start back on your regular meds. Hold the injection and plaquenil for now.

## 2011-04-08 ENCOUNTER — Encounter (HOSPITAL_COMMUNITY): Payer: Self-pay

## 2011-04-08 ENCOUNTER — Emergency Department (HOSPITAL_COMMUNITY)
Admission: EM | Admit: 2011-04-08 | Discharge: 2011-04-08 | Disposition: A | Discharge status: Home / Self Care | Payer: BC Managed Care – PPO | Attending: Emergency Medicine | Admitting: Emergency Medicine

## 2011-04-08 DIAGNOSIS — M79609 Pain in unspecified limb: Secondary | ICD-10-CM | POA: Insufficient documentation

## 2011-04-08 DIAGNOSIS — X58XXXA Exposure to other specified factors, initial encounter: Secondary | ICD-10-CM | POA: Insufficient documentation

## 2011-04-08 DIAGNOSIS — F3289 Other specified depressive episodes: Secondary | ICD-10-CM | POA: Insufficient documentation

## 2011-04-08 DIAGNOSIS — Z79899 Other long term (current) drug therapy: Secondary | ICD-10-CM | POA: Insufficient documentation

## 2011-04-08 DIAGNOSIS — S61511A Laceration without foreign body of right wrist, initial encounter: Secondary | ICD-10-CM

## 2011-04-08 DIAGNOSIS — J45909 Unspecified asthma, uncomplicated: Secondary | ICD-10-CM | POA: Insufficient documentation

## 2011-04-08 DIAGNOSIS — M25539 Pain in unspecified wrist: Secondary | ICD-10-CM | POA: Insufficient documentation

## 2011-04-08 DIAGNOSIS — S61509A Unspecified open wound of unspecified wrist, initial encounter: Secondary | ICD-10-CM | POA: Insufficient documentation

## 2011-04-08 DIAGNOSIS — S61409A Unspecified open wound of unspecified hand, initial encounter: Secondary | ICD-10-CM | POA: Insufficient documentation

## 2011-04-08 DIAGNOSIS — F329 Major depressive disorder, single episode, unspecified: Secondary | ICD-10-CM | POA: Insufficient documentation

## 2011-04-08 NOTE — ED Notes (Signed)
Wound care provided per PA. Pt has bacitracin and band-aid applied

## 2011-04-08 NOTE — ED Provider Notes (Signed)
History     CSN: [id number]  Arrival date & time 04/08/11  1646   First MD Initiated Contact with Patient 04/08/11 1843      Chief Complaint  Patient presents with  . Laceration    (Consider location/radiation/quality/duration/timing/severity/associated sxs/prior treatment) Patient is a 31 y.o. female presenting with skin laceration. The history is provided by the patient.  Laceration  The incident occurred 3 to 5 hours ago. The laceration is located on the right hand. The laceration is 2 cm in size. The laceration mechanism was a broken glass. The pain is at a severity of 4/10. The pain is mild. The pain has been constant since onset.  Pt was putting away dishes when a mug fell off the shelf and broke cutting her on the right wrist and left hand. No numbness or weakness distal to the cut. No other complaints. Hemostatic.  Past Medical History  Diagnosis Date  . ALLERGIC RHINITIS   . ASTHMA   . DEPRESSION   . MIGRAINE HEADACHE   . 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 Rheumatoid arthritis     neg CCP, +RF - follows with Dr. Roberts Gaudy in [place]    Past Surgical History  Procedure Date  . Hemorrhoid surgery 1983    Family History  Problem Relation Age of Onset  . Arthritis Other     grandparent  . Hyperlipidemia Other     parent & grandparent  . Stroke Other     grandparent  . Diabetes Other     other relative    History  Substance Use Topics  . Smoking status: Former Games developer  . Smokeless tobacco: Not on file  . Alcohol Use: Yes     social    OB History    Grav Para Term Preterm Abortions TAB SAB Ect Mult Living                  Review of Systems  Constitutional: Negative.   HENT: Negative.   Eyes: Negative.   Respiratory: Negative.   Cardiovascular: Negative.   Gastrointestinal: Negative.   Genitourinary: Negative.   Musculoskeletal: Negative.   Skin:       Laceration   Neurological: Negative for weakness and numbness.  Psychiatric/Behavioral: Negative.     Allergies  Aspirin; Penicillins; and Sulfonamide derivatives  Home Medications   Current Outpatient Rx  Name Route Sig Dispense Refill  . ACETAMINOPHEN 500 MG PO TABS Oral Take 500 mg by mouth every 6 (six) hours as needed. pain     . ALBUTEROL SULFATE HFA 108 (90 BASE) MCG/ACT IN AERS Inhalation Inhale 2 puffs into the lungs as needed.      Marland Kitchen AMLODIPINE BESYLATE 2.5 MG PO TABS Oral Take 2.5 mg by mouth daily.      [facility] CIPROFLOXACIN HCL 500 MG PO TABS Oral Take 500 mg by mouth 2 (two) times daily. Pt started on 04-07-11 for 3 days. Pt's on day 2 of therapy     . FEXOFENADINE HCL 180 MG PO TABS Oral Take 180 mg by mouth daily.      Marland Kitchen HYDROXYCHLOROQUINE SULFATE 200 MG PO TABS Oral Take by mouth 2 (two) times daily.      . MELOXICAM 15 MG PO TABS Oral Take 15 mg by mouth daily.      Kathrynn Running ESTRADIOL-FE 0.8-25 MG-MCG PO CHEW Oral Chew 1 tablet by mouth daily.      Marland Kitchen  PREDNISOLONE ACETATE 1 % OP SUSP Right Eye Place 1 drop into the right eye 4 (four) times daily. Started on Wednesday for 7 day therapy     . SERTRALINE HCL 100 MG PO TABS Oral Take 100 mg by mouth daily.      . CERTOLIZUMAB PEGOL 2 X 200 MG Damar KIT Subcutaneous Inject 200 mg into the skin every 14 (fourteen) days.      BP 119/83  Pulse 93  Temp 97.5 F (36.4 C)  Resp 16  SpO2 100%  LMP 04/08/2011  Physical Exam  Nursing note and vitals reviewed. Constitutional: She is oriented to person, place, and time. She appears well-developed and well-nourished.  HENT:  Head: Normocephalic.  Neck: Neck supple.  Cardiovascular: Normal rate, regular rhythm and normal heart sounds.   Pulmonary/Chest: Effort normal and breath sounds normal. No respiratory distress.  Musculoskeletal: Normal range of motion.       42m laceration to the anterior right wrist. Hemostatic, gaping. Tiny pin point lacerations to left hand. Full rom of right wrist  and all fingers.   Neurological: She is alert and oriented to person, place, and time.  Skin: Skin is warm and dry.  Psychiatric: She has a normal mood and affect.    ED Course  Procedures (including critical care time)  LACERATION REPAIR Performed by: Lottie Mussel Authorized by: Jaynie Crumble A Consent: Verbal consent obtained. Risks and benefits: risks, benefits and alternatives were discussed Consent given by: patient Patient identity confirmed: provided demographic data Prepped and Draped in normal sterile fashion Wound explored  Laceration Location: right anterior wrist. Laceration Length: 2cm  No Foreign Bodies seen or palpated  Anesthesia: local infiltration  Local anesthetic: lidocaine 2%w/ epinephrine  Anesthetic total: 2ml  Irrigation method: syringe Amount of cleaning: standard  Skin closure: prolene 5.0  Number of sutures: 3 Technique: simple interrupted Patient tolerance: Patient tolerated the procedure well with no immediate complications.  Tetanus up to date. No signs of ligamentous injury. Will d/c home.  MDM          Lottie Mussel, PA 04/08/11 1942

## 2011-04-08 NOTE — ED Notes (Signed)
Pt in from home with laceration to the right wrist states broken glass fell on her arm pressure dressing applied bleeding controlled presents with about a 1cm laceration to the anterior wrist

## 2011-04-08 NOTE — ED Notes (Signed)
Pt has small inch long laceration to right anterior wrist. Bleeding controlled. Pt reports recent Tetanus shot.

## 2011-04-09 NOTE — ED Provider Notes (Signed)
Medical screening examination/treatment/procedure(s) were performed by non-physician practitioner and as supervising physician I was immediately available for consultation/collaboration.  Lior Cartelli, MD 04/09/11 0119 

## 2011-04-10 ENCOUNTER — Encounter: Payer: Self-pay | Admitting: Internal Medicine

## 2011-04-10 ENCOUNTER — Ambulatory Visit (INDEPENDENT_AMBULATORY_CARE_PROVIDER_SITE_OTHER): Payer: BC Managed Care – PPO | Admitting: Internal Medicine

## 2011-04-10 VITALS — BP 100/72 | HR 80 | Temp 98.5°F

## 2011-04-10 DIAGNOSIS — S61509A Unspecified open wound of unspecified wrist, initial encounter: Secondary | ICD-10-CM

## 2011-04-10 DIAGNOSIS — S61519A Laceration without foreign body of unspecified wrist, initial encounter: Secondary | ICD-10-CM

## 2011-04-10 NOTE — Progress Notes (Signed)
  Subjective:    Patient ID: Joann Harrison, female    DOB: [birth year], 31 y.o.   MRN: [id number]  HPI  Here for follow up - Seen in the emergency room 48 hours ago for laceration to right wrist Stitches placed by emergency provider Stitches loosened spontaneously in past 24 hours  Past Medical History  Diagnosis Date  . ALLERGIC RHINITIS   . ASTHMA   . DEPRESSION   . MIGRAINE HEADACHE   . 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 Rheumatoid arthritis     neg CCP, +RF - follows with Dr. Roberts Gaudy in [place]    Review of Systems  Constitutional: Negative for fatigue.  Musculoskeletal: Negative for myalgias and joint swelling.       Objective:   Physical Exam  BP 100/72  Pulse 80  Temp(Src) 98.5 F (36.9 C) (Oral)  SpO2 96%  LMP 04/08/2011 Gen: NAD MSkel: R wrist - normal ROM, no swelling or tenderness Skin: lacteration 1 cm - well approx - no .redness, drainage - unraveled stitch x 3 - removed     Assessment & Plan:  Laceration, wrist - reassurance and "stitches removed" - conservative care recommended -

## 2011-04-10 NOTE — Patient Instructions (Signed)
It was good to see you today. Her stitches have been removed today Continue wound care and dressing as discussed - okay to wash Call if increased redness, pain, thick drainage or other problems

## 2011-05-09 ENCOUNTER — Ambulatory Visit (INDEPENDENT_AMBULATORY_CARE_PROVIDER_SITE_OTHER): Payer: BC Managed Care – PPO | Admitting: Internal Medicine

## 2011-05-09 ENCOUNTER — Ambulatory Visit: Payer: BC Managed Care – PPO | Admitting: Internal Medicine

## 2011-05-09 ENCOUNTER — Encounter: Payer: Self-pay | Admitting: Internal Medicine

## 2011-05-09 VITALS — BP 110/82 | HR 91 | Temp 97.5°F

## 2011-05-09 DIAGNOSIS — J0100 Acute maxillary sinusitis, unspecified: Secondary | ICD-10-CM

## 2011-05-09 DIAGNOSIS — M359 Systemic involvement of connective tissue, unspecified: Secondary | ICD-10-CM

## 2011-05-09 DIAGNOSIS — M069 Rheumatoid arthritis, unspecified: Secondary | ICD-10-CM

## 2011-05-09 MED ORDER — DOXYCYCLINE HYCLATE 100 MG PO TABS: 100.0000 mg | ORAL_TABLET | Freq: Two times a day (BID) | ORAL | Status: AC

## 2011-05-09 MED ORDER — HYDROCODONE-HOMATROPINE 5-1.5 MG/5ML PO SYRP: 5.0000 mL | ORAL_SOLUTION | Freq: Four times a day (QID) | ORAL | Status: AC | PRN

## 2011-05-09 NOTE — Patient Instructions (Signed)
It was good to see you today. Doxycycline antibiotics and narcotic cough syrup - Your prescription(s) have been submitted to your pharmacy. Please take as directed and contact our office if you believe you are having problem(s) with the medication(s). Continue your Allegra and/or Benadryl as discussed - Hydrate, rest and call us if symptoms are worse or improved in 10-14 days

## 2011-05-09 NOTE — Progress Notes (Signed)
.    Subjective:    HPI  complains of head cold symptoms  Onset >1 week ago, wax/wane symptoms  associated with rhinorrhea, sneezing, PND, sore throat, mild headache and low grade fever Also myalgias, sinus pressure and mild-mod chest congestion > symptoms worse at night Little relief with OTC meds Precipitated by sick contacts  Past Medical History  Diagnosis Date  . ALLERGIC RHINITIS   . ASTHMA   . DEPRESSION   . MIGRAINE HEADACHE   . 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 Rheumatoid arthritis     neg CCP, +RF - follows with Dr. Roberts Gaudy in [place]    Review of Systems Constitutional: No night sweats, no unexpected weight change Pulmonary: No pleurisy or hemoptysis Cardiovascular: No chest pain or palpitations     Objective:   Physical Exam BP 110/82  Pulse 91  Temp(Src) 97.5 F (36.4 C) (Oral)  SpO2 97% GEN: mildly ill appearing and audible head>chest congestion HENT: NCAT, mild sinus tenderness over maxillary region bilaterally, nares with clear discharge, oropharynx mild erythema, no exudate Eyes: Vision grossly intact, no conjunctivitis Lungs: Clear to auscultation without rhonchi or wheeze, no increased work of breathing Cardiovascular: Regular rate and rhythm, no bilateral edema      Assessment & Plan:  Viral URI >>sinusitis in immunosuppressed individual Cough, postnasal drip related to above RA - chronic immunosuppressant rx   Empiric antibiotics prescribed due to symptom duration greater than 7 days and co-morbid autoimmune dz/suppression tx Prescription cough suppression - new prescriptions done Symptomatic care with Tylenol or Advil, hydration and rest -  salt gargle advised as needed

## 2012-07-22 ENCOUNTER — Encounter: Payer: Self-pay | Admitting: Internal Medicine

## 2012-07-22 ENCOUNTER — Ambulatory Visit (INDEPENDENT_AMBULATORY_CARE_PROVIDER_SITE_OTHER): Payer: BC Managed Care – PPO | Admitting: Internal Medicine

## 2012-07-22 DIAGNOSIS — L270 Generalized skin eruption due to drugs and medicaments taken internally: Secondary | ICD-10-CM

## 2012-07-22 DIAGNOSIS — J309 Allergic rhinitis, unspecified: Secondary | ICD-10-CM

## 2012-07-22 DIAGNOSIS — M359 Systemic involvement of connective tissue, unspecified: Secondary | ICD-10-CM

## 2012-07-22 MED ORDER — CETIRIZINE HCL 10 MG PO TABS: 10.0000 mg | ORAL_TABLET | Freq: Every day | ORAL | Status: DC

## 2012-07-22 MED ORDER — FLUTICASONE PROPIONATE 50 MCG/ACT NA SUSP: 2.0000 | Freq: Every day | NASAL | Status: DC

## 2012-07-22 NOTE — Patient Instructions (Signed)
It was good to see you today. We have reviewed your prior records including labs and tests today The rash on your right buttock is likely sensitivity to the last steroid injection. Let your rheumatologist know about same - no evidence for infection, and no alternate treatment recommended at this time For your allergies, try Zyrtec once daily, decongestants such as Sudafed, Afrin okay for up to 5 days and nasal saline rinse such as Netti pot Will also add daily nasal steroid spray: generic Flonase 2 sprays each morning for the next 30 days Your prescription(s) have been submitted to your pharmacy. Please take as directed and contact our office if you believe you are having problem(s) with the medication(s). Followup annually for physical and review, call sooner if problems

## 2012-07-22 NOTE — Assessment & Plan Note (Signed)
RA, follows with rheum in [place] for same Interval hx reviewed The current medical regimen is effective;  continue present plan and medications.

## 2012-07-22 NOTE — Assessment & Plan Note (Signed)
Change allegra to Zyrtec Add flonase - see above

## 2012-07-22 NOTE — Progress Notes (Signed)
  Subjective:    Patient ID: Joann Harrison, female    DOB: Aug 06, 1980, 32 y.o.   MRN: [id number]  HPI  complains of rash, r buttock Also increase allergy and sinus symptoms in past 2 weeks  Past Medical History  Diagnosis Date  . ALLERGIC RHINITIS   . ASTHMA   . DEPRESSION   . MIGRAINE HEADACHE   . 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 Rheumatoid arthritis     neg CCP, +RF - follows with Dr. Roberts Gaudy in [place]    Review of Systems  Constitutional: Negative for fever and fatigue.  HENT: Positive for congestion, rhinorrhea, sneezing, postnasal drip and sinus pressure. Negative for ear pain.   Respiratory: Negative for cough and shortness of breath.   Cardiovascular: Negative for chest pain and palpitations.  Neurological: Positive for headaches (sinus).       Objective:   Physical Exam BP 120/88  Pulse 90  Temp(Src) 98.7 F (37.1 C) (Oral)  Wt 169 lb 12.8 oz (77.021 kg)  BMI 29.13 kg/m2  SpO2 97% Wt Readings from Last 3 Encounters:  07/22/12 169 lb 12.8 oz (77.021 kg)  01/27/11 156 lb 6.4 oz (70.943 kg)  04/01/10 152 lb 12 oz (69.287 kg)   Constitutional: She appears well-developed and well-nourished. No distress.  HENT: Head: Normocephalic and atraumatic. Sinus nontender Ears: B TMs ok, no erythema or effusion; Nose: swollen paler turninated, no purulence, clear drainage. Mouth/Throat: Oropharynx is clear and moist. No oropharyngeal exudate.  Eyes: Conjunctivae and EOM are normal. Pupils are equal, round, and reactive to light. No scleral icterus.  Neck: Normal range of motion. Neck supple. No JVD or LAD present. No thyromegaly present.  Cardiovascular: Normal rate, regular rhythm and normal heart sounds.  No murmur heard. No BLE edema. Pulmonary/Chest: Effort normal and breath sounds normal. No respiratory distress. She has no wheezes.  Neurological: She is alert and oriented to person, place,  and time. No cranial nerve deficit. Coordination, balance, strength, speech and gait are normal.  Skin: localized red mark without inducration or abscess at site of IM steroid administration R upper buttock - no cellulitis. Remaining skin is warm and dry. No rash noted. No erythema.  Psychiatric: She has a normal mood and affect. Her behavior is normal. Judgment and thought content normal.   No results found for this basename: WBC, HGB, HCT, PLT, GLUCOSE, CHOL, TRIG, HDL, LDLDIRECT, LDLCALC, ALT, AST, NA, K, CL, CREATININE, BUN, CO2, TSH, PSA, INR, GLUF, HGBA1C, MICROALBUR       Assessment & Plan:    Rash - r upper buttock at site of steroid IM injection at rheum last week - suspect sensitvity to compound/preservtive of IM steroid preperation - no infection - education and reassurance provided  allergic rhinitis with allergic sinusitus - OTC meds reviewed - histamine and decongestants, also nasal saline. We'll add steroid nasal spray preparation. Patient advised on same. No evidence for infection, patient call if symptoms worse or improve

## 2012-10-20 ENCOUNTER — Other Ambulatory Visit: Payer: Self-pay | Admitting: Internal Medicine

## 2012-11-21 ENCOUNTER — Ambulatory Visit (INDEPENDENT_AMBULATORY_CARE_PROVIDER_SITE_OTHER): Payer: BC Managed Care – PPO | Admitting: Internal Medicine

## 2012-11-21 ENCOUNTER — Encounter: Payer: Self-pay | Admitting: Internal Medicine

## 2012-11-21 VITALS — BP 120/90 | HR 95 | Temp 98.4°F | Wt 177.8 lb

## 2012-11-21 DIAGNOSIS — M359 Systemic involvement of connective tissue, unspecified: Secondary | ICD-10-CM

## 2012-11-21 DIAGNOSIS — M62838 Other muscle spasm: Secondary | ICD-10-CM

## 2012-11-21 DIAGNOSIS — M542 Cervicalgia: Secondary | ICD-10-CM

## 2012-11-21 MED ORDER — CARISOPRODOL 350 MG PO TABS: 350.0000 mg | ORAL_TABLET | Freq: Three times a day (TID) | ORAL | Status: DC | PRN

## 2012-11-21 NOTE — Progress Notes (Signed)
  Subjective:    Patient ID: Joann Harrison, female    DOB: 11-19-1980, 32 y.o.   MRN: [id number]  Neck Pain  This is a new problem. The current episode started 1 to 4 weeks ago. The problem occurs daily. The problem has been unchanged. The pain is associated with a sleep position. The pain is present in the midline, left side and right side. The quality of the pain is described as aching. The pain is moderate. The symptoms are aggravated by position. The pain is same all the time. Stiffness is present all day. Pertinent negatives include no chest pain, fever, headaches, leg pain, numbness, pain with swallowing, photophobia, tingling, trouble swallowing, visual change, weakness or weight loss. She has tried acetaminophen, heat, ice and NSAIDs for the symptoms. The treatment provided mild relief.   Past Medical History  Diagnosis Date  . ALLERGIC RHINITIS   . ASTHMA   . DEPRESSION   . MIGRAINE HEADACHE   . 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 Rheumatoid arthritis(714.0)     neg CCP, +RF - follows with Dr. Roberts Gaudy in [place]    Review of Systems  Constitutional: Negative for fever and weight loss.  HENT: Positive for neck pain. Negative for trouble swallowing.   Eyes: Negative for photophobia.  Cardiovascular: Negative for chest pain.  Neurological: Negative for tingling, weakness, numbness and headaches.       Objective:   Physical Exam BP 120/90  Pulse 95  Temp(Src) 98.4 F (36.9 C) (Oral)  Wt 177 lb 12.8 oz (80.65 kg)  BMI 30.5 kg/m2  SpO2 97% Wt Readings from Last 3 Encounters:  11/21/12 177 lb 12.8 oz (80.65 kg)  07/22/12 169 lb 12.8 oz (77.021 kg)  01/27/11 156 lb 6.4 oz (70.943 kg)   Constitutional: She is overweight, but appears well-developed and well-nourished. No distress.  HENT: Head: Normocephalic and atraumatic. Ears: B TMs ok, no erythema or effusion; Nose: Nose normal. Mouth/Throat:  Oropharynx is clear and moist. No oropharyngeal exudate.  Eyes: Conjunctivae and EOM are normal. Pupils are equal, round, and reactive to light. No scleral icterus.  Neck: Normal range of motion. Neck supple, but tight trap B. No JVD present. No thyromegaly present.  Cardiovascular: Normal rate, regular rhythm and normal heart sounds.  No murmur heard. No BLE edema. Pulmonary/Chest: Effort normal and breath sounds normal. No respiratory distress. She has no wheezes.  Neurological: She is alert and oriented to person, place, and time. No cranial nerve deficit. Coordination, balance, strength, speech and gait are normal.  Skin: Skin is warm and dry. No rash noted. No erythema.  Psychiatric: She has a normal mood and affect. Her behavior is normal. Judgment and thought content normal.   No results found for this basename: WBC, HGB, HCT, PLT, GLUCOSE, CHOL, TRIG, HDL, LDLDIRECT, LDLCALC, ALT, AST, NA, K, CL, CREATININE, BUN, CO2, TSH, PSA, INR, GLUF, HGBA1C, MICROALBUR       Assessment & Plan:   Cervicalgia, neck spasm - no red flags on history or exam, specifically no medication changes, fever, history of trauma or weakness Add muscle relaxant to ongoing conservative therapy including NSAIDs, heat or ice and massage Home exercise program for neck stretching provided Patient agrees to call if symptoms unimproved in next 2 weeks, sooner if worse

## 2012-11-21 NOTE — Assessment & Plan Note (Signed)
RA, follows with rheum in [place] for same Interval hx reviewed -has recently completed low-dose prednisone for acute flare, feels unrelated to current neck symptoms Declines need for imaging at this time, but agrees to call if symptoms unimproved, sooner if worse

## 2012-11-21 NOTE — Patient Instructions (Addendum)
It was good to see you today. Medications reviewed and updated, use Soma as needed for muscle relaxer -no other changes recommended at this time. Your prescription(s) have been submitted to your pharmacy. Please take as directed and contact our office if you believe you are having problem(s) with the medication(s).  Cervical Strain and Sprain (Whiplash) with Rehab Cervical strain and sprains are injuries that commonly occur with "whiplash" injuries. Whiplash occurs when the neck is forcefully whipped backward or forward, such as during a motor vehicle accident. The muscles, ligaments, tendons, discs and nerves of the neck are susceptible to injury when this occurs. SYMPTOMS   Pain or stiffness in the front and/or back of neck  Symptoms may present immediately or up to 24 hours after injury.  Dizziness, headache, nausea and vomiting.  Muscle spasm with soreness and stiffness in the neck.  Tenderness and swelling at the injury site. CAUSES  Whiplash injuries often occur during contact sports or motor vehicle accidents.  RISK INCREASES WITH:  Osteoarthritis of the spine.  Situations that make head or neck accidents or trauma more likely.  High-risk sports (football, rugby, wrestling, hockey, auto racing, gymnastics, diving, contact karate or boxing).  Poor strength and flexibility of the neck.  Previous neck injury.  Poor tackling technique.  Improperly fitted or padded equipment. PREVENTION  Learn and use proper technique (avoid tackling with the head, spearing and head-butting; use proper falling techniques to avoid landing on the head).  Warm up and stretch properly before activity.  Maintain physical fitness:  Strength, flexibility and endurance.  Cardiovascular fitness.  Wear properly fitted and padded protective equipment, such as padded soft collars, for participation in contact sports. PROGNOSIS  Recovery for cervical strain and sprain injuries is dependent on  the extent of the injury. These injuries are usually curable in 1 week to 3 months with appropriate treatment.  RELATED COMPLICATIONS   Temporary numbness and weakness may occur if the nerve roots are damaged, and this may persist until the nerve has completely healed.  Chronic pain due to frequent recurrence of symptoms.  Prolonged healing, especially if activity is resumed too soon (before complete recovery). TREATMENT  Treatment initially involves the use of ice and medication to help reduce pain and inflammation. It is also important to perform strengthening and stretching exercises and modify activities that worsen symptoms so the injury does not get worse. These exercises may be performed at home or with a therapist. For patients who experience severe symptoms, a soft padded collar may be recommended to be worn around the neck.  Improving your posture may help reduce symptoms. Posture improvement includes pulling your chin and abdomen in while sitting or standing. If you are sitting, sit in a firm chair with your buttocks against the back of the chair. While sleeping, try replacing your pillow with a small towel rolled to 2 inches in diameter, or use a cervical pillow or soft cervical collar. Poor sleeping positions delay healing.  For patients with nerve root damage, which causes numbness or weakness, the use of a cervical traction apparatus may be recommended. Surgery is rarely necessary for these injuries. However, cervical strain and sprains that are present at birth (congenital) may require surgery. MEDICATION   If pain medication is necessary, nonsteroidal anti-inflammatory medications, such as aspirin and ibuprofen, or other minor pain relievers, such as acetaminophen, are often recommended.  Do not take pain medication for 7 days before surgery.  Prescription pain relievers may be given if deemed necessary  by your caregiver. Use only as directed and only as much as you need. HEAT AND  COLD:   Cold treatment (icing) relieves pain and reduces inflammation. Cold treatment should be applied for 10 to 15 minutes every 2 to 3 hours for inflammation and pain and immediately after any activity that aggravates your symptoms. Use ice packs or an ice massage.  Heat treatment may be used prior to performing the stretching and strengthening activities prescribed by your caregiver, physical therapist, or athletic trainer. Use a heat pack or a warm soak. SEEK MEDICAL CARE IF:   Symptoms get worse or do not improve in 2 weeks despite treatment.  New, unexplained symptoms develop (drugs used in treatment may produce side effects). EXERCISES RANGE OF MOTION (ROM) AND STRETCHING EXERCISES - Cervical Strain and Sprain These exercises may help you when beginning to rehabilitate your injury. In order to successfully resolve your symptoms, you must improve your posture. These exercises are designed to help reduce the forward-head and rounded-shoulder posture which contributes to this condition. Your symptoms may resolve with or without further involvement from your physician, physical therapist or athletic trainer. While completing these exercises, remember:   Restoring tissue flexibility helps normal motion to return to the joints. This allows healthier, less painful movement and activity.  An effective stretch should be held for at least 20 seconds, although you may need to begin with shorter hold times for comfort.  A stretch should never be painful. You should only feel a gentle lengthening or release in the stretched tissue. STRETCH- Axial Extensors  Lie on your back on the floor. You may bend your knees for comfort. Place a rolled up hand towel or dish towel, about 2 inches in diameter, under the part of your head that makes contact with the floor.  Gently tuck your chin, as if trying to make a "double chin," until you feel a gentle stretch at the base of your head.  Hold __________  seconds. Repeat __________ times. Complete this exercise __________ times per day.  STRETECH - Axial Extension   Stand or sit on a firm surface. Assume a good posture: chest up, shoulders drawn back, abdominal muscles slightly tense, knees unlocked (if standing) and feet hip width apart.  Slowly retract your chin so your head slides back and your chin slightly lowers.Continue to look straight ahead.  You should feel a gentle stretch in the back of your head. Be certain not to feel an aggressive stretch since this can cause headaches later.  Hold for __________ seconds. Repeat __________ times. Complete this exercise __________ times per day. STRETCH  Cervical Side Bend   Stand or sit on a firm surface. Assume a good posture: chest up, shoulders drawn back, abdominal muscles slightly tense, knees unlocked (if standing) and feet hip width apart.  Without letting your nose or shoulders move, slowly tip your right / left ear to your shoulder until your feel a gentle stretch in the muscles on the opposite side of your neck.  Hold __________ seconds. Repeat __________ times. Complete this exercise __________ times per day. STRETCH  Cervical Rotators   Stand or sit on a firm surface. Assume a good posture: chest up, shoulders drawn back, abdominal muscles slightly tense, knees unlocked (if standing) and feet hip width apart.  Keeping your eyes level with the ground, slowly turn your head until you feel a gentle stretch along the back and opposite side of your neck.  Hold __________ seconds. Repeat __________ times. Complete  this exercise __________ times per day. RANGE OF MOTION - Neck Circles   Stand or sit on a firm surface. Assume a good posture: chest up, shoulders drawn back, abdominal muscles slightly tense, knees unlocked (if standing) and feet hip width apart.  Gently roll your head down and around from the back of one shoulder to the back of the other. The motion should never be  forced or painful.  Repeat the motion 10-20 times, or until you feel the neck muscles relax and loosen. Repeat __________ times. Complete the exercise __________ times per day. STRENGTHENING EXERCISES - Cervical Strain and Sprain These exercises may help you when beginning to rehabilitate your injury. They may resolve your symptoms with or without further involvement from your physician, physical therapist or athletic trainer. While completing these exercises, remember:   Muscles can gain both the endurance and the strength needed for everyday activities through controlled exercises.  Complete these exercises as instructed by your physician, physical therapist or athletic trainer. Progress the resistance and repetitions only as guided.  You may experience muscle soreness or fatigue, but the pain or discomfort you are trying to eliminate should never worsen during these exercises. If this pain does worsen, stop and make certain you are following the directions exactly. If the pain is still present after adjustments, discontinue the exercise until you can discuss the trouble with your clinician. STRENGTH Cervical Flexors, Isometric  Face a wall, standing about 6 inches away. Place a small pillow, a ball about 6-8 inches in diameter, or a folded towel between your forehead and the wall.  Slightly tuck your chin and gently push your forehead into the soft object. Push only with mild to moderate intensity, building up tension gradually. Keep your jaw and forehead relaxed.  Hold 10 to 20 seconds. Keep your breathing relaxed.  Release the tension slowly. Relax your neck muscles completely before you start the next repetition. Repeat __________ times. Complete this exercise __________ times per day. STRENGTH- Cervical Lateral Flexors, Isometric   Stand about 6 inches away from a wall. Place a small pillow, a ball about 6-8 inches in diameter, or a folded towel between the side of your head and the  wall.  Slightly tuck your chin and gently tilt your head into the soft object. Push only with mild to moderate intensity, building up tension gradually. Keep your jaw and forehead relaxed.  Hold 10 to 20 seconds. Keep your breathing relaxed.  Release the tension slowly. Relax your neck muscles completely before you start the next repetition. Repeat __________ times. Complete this exercise __________ times per day. STRENGTH  Cervical Extensors, Isometric   Stand about 6 inches away from a wall. Place a small pillow, a ball about 6-8 inches in diameter, or a folded towel between the back of your head and the wall.  Slightly tuck your chin and gently tilt your head back into the soft object. Push only with mild to moderate intensity, building up tension gradually. Keep your jaw and forehead relaxed.  Hold 10 to 20 seconds. Keep your breathing relaxed.  Release the tension slowly. Relax your neck muscles completely before you start the next repetition. Repeat __________ times. Complete this exercise __________ times per day. POSTURE AND BODY MECHANICS CONSIDERATIONS - Cervical Strain and Sprain Keeping correct posture when sitting, standing or completing your activities will reduce the stress put on different body tissues, allowing injured tissues a chance to heal and limiting painful experiences. The following are general guidelines for improved  posture. Your physician or physical therapist will provide you with any instructions specific to your needs. While reading these guidelines, remember:  The exercises prescribed by your provider will help you have the flexibility and strength to maintain correct postures.  The correct posture provides the optimal environment for your joints to work. All of your joints have less wear and tear when properly supported by a spine with good posture. This means you will experience a healthier, less painful body.  Correct posture must be practiced with all of  your activities, especially prolonged sitting and standing. Correct posture is as important when doing repetitive low-stress activities (typing) as it is when doing a single heavy-load activity (lifting). PROLONGED STANDING WHILE SLIGHTLY LEANING FORWARD When completing a task that requires you to lean forward while standing in one place for a long time, place either foot up on a stationary 2-4 inch high object to help maintain the best posture. When both feet are on the ground, the low back tends to lose its slight inward curve. If this curve flattens (or becomes too large), then the back and your other joints will experience too much stress, fatigue more quickly and can cause pain.  RESTING POSITIONS Consider which positions are most painful for you when choosing a resting position. If you have pain with flexion-based activities (sitting, bending, stooping, squatting), choose a position that allows you to rest in a less flexed posture. You would want to avoid curling into a fetal position on your side. If your pain worsens with extension-based activities (prolonged standing, working overhead), avoid resting in an extended position such as sleeping on your stomach. Most people will find more comfort when they rest with their spine in a more neutral position, neither too rounded nor too arched. Lying on a non-sagging bed on your side with a pillow between your knees, or on your back with a pillow under your knees will often provide some relief. Keep in mind, being in any one position for a prolonged period of time, no matter how correct your posture, can still lead to stiffness. WALKING Walk with an upright posture. Your ears, shoulders and hips should all line-up. OFFICE WORK When working at a desk, create an environment that supports good, upright posture. Without extra support, muscles fatigue and lead to excessive strain on joints and other tissues. CHAIR:  A chair should be able to slide under your  desk when your back makes contact with the back of the chair. This allows you to work closely.  The chair's height should allow your eyes to be level with the upper part of your monitor and your hands to be slightly lower than your elbows.  Body position:  Your feet should make contact with the floor. If this is not possible, use a foot rest.  Keep your ears over your shoulders. This will reduce stress on your neck and low back. Document Released: 03/19/2005 Document Revised: 06/11/2011 Document Reviewed: 07/01/2008 [facility] Patient Information 2014 [place], [place].

## 2012-12-23 ENCOUNTER — Telehealth: Payer: Self-pay | Admitting: Internal Medicine

## 2012-12-23 NOTE — Telephone Encounter (Signed)
Patient Information:  Caller Name: Joann Harrison  Phone: [phone]  Patient: Joann Harrison, Joann Harrison  Gender: Female  DOB: [birth year]  Age: 32 Years  PCP: Rene Paci (Adults only)  Pregnant: No  Office Follow Up:  Does the office need to follow up with this patient?: No  Instructions For The Office: N/A   Symptoms  Reason For Call & Symptoms: Pt got up to use the BR in the middle of the night and hit on the outside of her eye/brow bone on the door frame. This am the pt has nausea and a mild headache. There is no bruise.  Reviewed Health History In EMR: Yes  Reviewed Medications In EMR: Yes  Reviewed Allergies In EMR: Yes  Reviewed Surgeries / Procedures: Yes  Date of Onset of Symptoms: 12/22/2012 OB / GYN:  LMP: 12/09/2012  Guideline(s) Used:  Head Injury  Disposition Per Guideline:   Home Care  Reason For Disposition Reached:   Minor head injury  Advice Given:  Reassurance - Direct Blow (Contusion, Bruise)  Symptoms are mild pain, swelling, and/or bruising. Sometimes there can also be mild dizziness and nausea.  Apply a Cold Pack:  Apply a cold pack or an ice bag (wrapped in a moist towel) to the area for 20 minutes. Repeat in 1 hour, then every 4 hours while awake.  Observation After Head Injury:  Watch a person with a head injury very closely during the first 2 hours following the injury.  Wake the head-injured person every 4 hours for the first 24 hours. Check to see if he (or she) can walk and talk.  Expected Course:  It may take 2 weeks for pain and tenderness of the injured area to go away.  Call Back If:  Severe headache  Extremity weakness or numbness occurs  Slurred speech or blurred vision occurs  Vomiting occurs  Call Back If:  You have more questions  You become worse.  Patient Will Follow Care Advice:  YES

## 2013-04-28 ENCOUNTER — Telehealth: Payer: Self-pay | Admitting: Internal Medicine

## 2013-04-28 NOTE — Telephone Encounter (Signed)
Patient Information:  Caller Name: Abraham  Phone: [phone]  Patient: Joann Harrison  Gender: Female  DOB: [birth year]  Age: 33 Years  PCP: Rene Paci (Adults only)  Pregnant: No  Office Follow Up:  Does the office need to follow up with this patient?: No  Instructions For The Office: N/A   Symptoms  Reason For Call & Symptoms: Sinus congestion since 04/25/13. She is having moderate headaches and some facial pressure. She is feeling some shortness of breath d/t congestion and mouth breathing. Denies sore throat, cough or ear pain. Afebrile.  Reviewed Health History In EMR: Yes  Reviewed Medications In EMR: Yes  Reviewed Allergies In EMR: Yes  Reviewed Surgeries / Procedures: Yes  Date of Onset of Symptoms: 04/25/2013  Treatments Tried: Afrin x 3 days, Muscinex severe sinus  Treatments Tried Worked: No OB / GYN:  LMP: 04/10/2013  Guideline(s) Used:  Sinus Pain and Congestion  Disposition Per Guideline:   Home Care  Reason For Disposition Reached:   Sinus congestion as part of a cold, present < 10 days  Advice Given:  Reassurance:   Sinus congestion is a normal part of a cold.  Usually home treatment with nasal washes can prevent an actual bacterial sinus infection.  Antibiotics are not helpful for the sinus congestion that occurs with colds.  For a Runny Nose With Profuse Discharge:  Nasal mucus and discharge helps to wash viruses and bacteria out of the nose and sinuses.  Blowing the nose is all that is needed.  If the skin around your nostrils gets irritated, apply a tiny amount of petroleum ointment to the nasal openings once or twice a day.  For a Stuffy Nose - Use Nasal Washes:  Introduction: Saline (salt water) nasal irrigation (nasal wash) is an effective and simple home remedy for treating stuffy nose and sinus congestion. The nose can be irrigated by pouring, spraying, or squirting salt water into the nose and then letting it run back out.  How it Helps: The  salt water rinses out excess mucus, washes out any irritants (dust, allergens) that might be present, and moistens the nasal cavity.  Methods: There are several ways to perform nasal irrigation. You can use a saline nasal spray bottle (available over-the-counter), a rubber ear syringe, a medical syringe without the needle, or a Neti Pot.  Nasal Decongestants for a Very Stuffy or Runny Nose:  If you have a very stuffy nose, nasal decongestant medicines can shrink the swollen nasal mucosa and allow for easier breathing. If you have a very runny nose, these medicines can reduce the amount of drainage. They may be taken as pills by mouth or as a nasal spray.  Most people do Not need to use these medicines. If your nose feels blocked, you should try using nasal washes first.  Pseudoephedrine (Sudafed) is available OTC in pill form. Typical adult dosage is two 30 mg tablets every 6 hours.  Caution - Nasal Decongestants:  Do not take these medications if you have high blood pressure, heart disease, prostate problems, or an overactive thyroid.  Do not take these medications if you are pregnant.  Do not take these medications if you have used an MAO inhibitor such as isocarboxazid (Marplan), phenelzine (Nardil), rasagiline (Azilect), selegiline (Eldepryl, Emsam), or tranylcypromine (Parnate) in the past 2 weeks. Life-threatening side effects can occur.  Do not use these medications for more than 3 days (Reason: rebound nasal congestion).  Pain and Fever Medicines:  For pain or fever  relief, take either acetaminophen or ibuprofen.  Hydration:  Drink plenty of liquids (6-8 glasses of water daily). If the air in your home is dry, use a cool mist humidifier  Expected Course:  Sinus congestion from viral upper respiratory infections (colds) usually lasts 5-10 days.  Occasionally a cold can worsen and turn into bacterial sinusitis. Clues to this are sinus symptoms lasting longer than 10 days, fever lasting longer  than 3 days, and worsening pain. Bacterial sinusitis may need antibiotic treatment.  Call Back If:   Severe pain lasts longer than 2 hours after pain medicine  Sinus pain lasts longer than 1 day after starting treatment using nasal washes  Sinus congestion (fullness) lasts longer than 10 days  Fever lasts longer than 3 days  You become worse.  Patient Will Follow Care Advice:  YES

## 2013-04-30 ENCOUNTER — Encounter: Payer: Self-pay | Admitting: Family Medicine

## 2013-04-30 ENCOUNTER — Ambulatory Visit (INDEPENDENT_AMBULATORY_CARE_PROVIDER_SITE_OTHER): Payer: BC Managed Care – PPO | Admitting: Family Medicine

## 2013-04-30 VITALS — BP 118/72 | HR 100 | Temp 98.2°F | Resp 16 | Wt 153.1 lb

## 2013-04-30 DIAGNOSIS — L309 Dermatitis, unspecified: Secondary | ICD-10-CM

## 2013-04-30 DIAGNOSIS — L259 Unspecified contact dermatitis, unspecified cause: Secondary | ICD-10-CM

## 2013-04-30 DIAGNOSIS — J020 Streptococcal pharyngitis: Secondary | ICD-10-CM

## 2013-04-30 MED ORDER — TRIAMCINOLONE ACETONIDE 0.5 % EX CREA: 1.0000 "application " | TOPICAL_CREAM | Freq: Two times a day (BID) | CUTANEOUS | Status: DC

## 2013-04-30 MED ORDER — CEPHALEXIN 500 MG PO CAPS: 500.0000 mg | ORAL_CAPSULE | Freq: Two times a day (BID) | ORAL | Status: DC

## 2013-04-30 NOTE — Progress Notes (Signed)
SUBJECTIVE:  Joann Harrison is a 33 y.o. female who complains of congestion, sore throat, swollen glands, post nasal drip, headache, bilateral sinus pain, chills and pain while swallowing for 14 days. She denies a history of chest pain, dizziness, shortness of breath, vomiting, weakness and weight loss and has a history of asthma. Patient denies smoke cigarettes.  Past Medical History  Diagnosis Date  . ALLERGIC RHINITIS   . ASTHMA   . DEPRESSION   . MIGRAINE HEADACHE   . 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 Rheumatoid arthritis(714.0)     neg CCP, +RF - follows with Dr. Roberts Gaudy in [place]   Past Surgical History  Procedure Laterality Date  . Hemorrhoid surgery  1983   History  Substance Use Topics  . Smoking status: Former Games developer  . Smokeless tobacco: Not on file  . Alcohol Use: Yes     Comment: social   Family History  Problem Relation Age of Onset  . Arthritis Other     grandparent  . Hyperlipidemia Other     parent & grandparent  . Stroke Other     grandparent  . Diabetes Other     other relative   Blood pressure 118/72, pulse 100, temperature 98.2 F (36.8 C), temperature source Oral, resp. rate 16, weight 153 lb 1.3 oz (69.437 kg), SpO2 96.00%.    OBJECTIVE: She appears well, vital signs are as noted. Ears normal.  Throat and pharynx moderate erythema, exudate present.  Neck supple. Mildadenopathy in the neck. Nose is congested. Sinuses non tender. The chest is clear, without wheezes or rales. Neurologically cranial nerves II through XII are intact Skin the patient's hand show severe eczema as well as with some erythema. This could correspond to a post strap rash  ASSESSMENT:  strep pharyngitis, RA, eczema and ? Scarlet fever.   PLAN: Symptomatic therapy suggested: push fluids, rest and return office visit prn if symptoms persist or worsen. Keflex given secondary to history of penicillin  allergy. Call or return to clinic prn if these symptoms worsen or fail to improve as anticipated. If rash does not improve would run test to rule out secondary syphilis.

## 2013-04-30 NOTE — Progress Notes (Signed)
Pre-visit discussion using our clinic review tool. No additional management support is needed unless otherwise documented below in the visit note.  

## 2013-04-30 NOTE — Patient Instructions (Addendum)
Very nice to meet you Keflex 2 times daily for next week Triamcinolone on hands 1-2 times daily until resolved Come back in 1 week if not better.  Strep Throat Strep throat is an infection of the throat caused by a bacteria named Streptococcus pyogenes. Your caregiver may call the infection streptococcal "tonsillitis" or "pharyngitis" depending on whether there are signs of inflammation in the tonsils or back of the throat. Strep throat is most common in children aged 5 15 years during the cold months of the year, but it can occur in people of any age during any season. This infection is spread from person to person (contagious) through coughing, sneezing, or other close contact. SYMPTOMS   Fever or chills.  Painful, swollen, red tonsils or throat.  Pain or difficulty when swallowing.  White or yellow spots on the tonsils or throat.  Swollen, tender lymph nodes or "glands" of the neck or under the jaw.  Red rash all over the body (rare). DIAGNOSIS  Many different infections can cause the same symptoms. A test must be done to confirm the diagnosis so the right treatment can be given. A "rapid strep test" can help your caregiver make the diagnosis in a few minutes. If this test is not available, a light swab of the infected area can be used for a throat culture test. If a throat culture test is done, results are usually available in a day or two. TREATMENT  Strep throat is treated with antibiotic medicine. HOME CARE INSTRUCTIONS   Gargle with 1 tsp of salt in 1 cup of warm water, 3 4 times per day or as needed for comfort.  Family members who also have a sore throat or fever should be tested for strep throat and treated with antibiotics if they have the strep infection.  Make sure everyone in your household washes their hands well.  Do not share food, drinking cups, or personal items that could cause the infection to spread to others.  You may need to eat a soft food diet until your  sore throat gets better.  Drink enough water and fluids to keep your urine clear or pale yellow. This will help prevent dehydration.  Get plenty of rest.  Stay home from school, daycare, or work until you have been on antibiotics for 24 hours.  Only take over-the-counter or prescription medicines for pain, discomfort, or fever as directed by your caregiver.  If antibiotics are prescribed, take them as directed. Finish them even if you start to feel better. SEEK MEDICAL CARE IF:   The glands in your neck continue to enlarge.  You develop a rash, cough, or earache.  You cough up green, yellow-brown, or bloody sputum.  You have pain or discomfort not controlled by medicines.  Your problems seem to be getting worse rather than better. SEEK IMMEDIATE MEDICAL CARE IF:   You develop any new symptoms such as vomiting, severe headache, stiff or painful neck, chest pain, shortness of breath, or trouble swallowing.  You develop severe throat pain, drooling, or changes in your voice.  You develop swelling of the neck, or the skin on the neck becomes red and tender.  You have a fever.  You develop signs of dehydration, such as fatigue, dry mouth, and decreased urination.  You become increasingly sleepy, or you cannot wake up completely. Document Released: 03/16/2000 Document Revised: 03/05/2012 Document Reviewed: 05/18/2010 [facility] Patient Information 2014 [place], [place].

## 2013-07-20 ENCOUNTER — Other Ambulatory Visit: Payer: Self-pay | Admitting: Internal Medicine

## 2014-01-19 ENCOUNTER — Other Ambulatory Visit: Payer: Self-pay | Admitting: Internal Medicine

## 2014-01-22 ENCOUNTER — Ambulatory Visit: Payer: BC Managed Care – PPO | Admitting: Family

## 2014-03-17 ENCOUNTER — Ambulatory Visit (INDEPENDENT_AMBULATORY_CARE_PROVIDER_SITE_OTHER): Payer: BC Managed Care – PPO | Admitting: Family

## 2014-03-17 ENCOUNTER — Encounter: Payer: Self-pay | Admitting: Family

## 2014-03-17 VITALS — BP 126/90 | HR 100 | Temp 98.4°F | Resp 18 | Ht 65.0 in | Wt 164.1 lb

## 2014-03-17 DIAGNOSIS — J029 Acute pharyngitis, unspecified: Secondary | ICD-10-CM

## 2014-03-17 LAB — POCT RAPID STREP A (OFFICE): RAPID STREP A SCREEN: NEGATIVE

## 2014-03-17 MED ORDER — DOXYCYCLINE HYCLATE 100 MG PO TABS: 100.0000 mg | ORAL_TABLET | Freq: Two times a day (BID) | ORAL | Status: DC

## 2014-03-17 NOTE — Patient Instructions (Signed)
Thank you for choosing [facility].  Summary/Instructions:  Your prescription(s) have been submitted to your pharmacy. Please take as directed and contact our office if you believe you are having problem(s) with the medication(s).  If your symptoms worsen or fail to improve, please contact our office for further instruction, or in case of emergency go directly to the emergency room at the closest medical facility.   General Recommendations: Please drink plenty of fluids. Sleep in humidified air if possible. Use saline nasal sprays or the Netti pot as needed.   Medicaitons:  Flonase as needed to assist with congestion. You may use Mucinex to help break up congestion as you feel is needed. Tylenol as needed for fever. Sudafed for congestion. Delsym for cough.   Sinusitis Sinusitis is redness, soreness, and inflammation of the paranasal sinuses. Paranasal sinuses are air pockets within the bones of your face (beneath the eyes, the middle of the forehead, or above the eyes). In healthy paranasal sinuses, mucus is able to drain out, and air is able to circulate through them by way of your nose. However, when your paranasal sinuses are inflamed, mucus and air can become trapped. This can allow bacteria and other germs to grow and cause infection. Sinusitis can develop quickly and last only a short time (acute) or continue over a long period (chronic). Sinusitis that lasts for more than 12 weeks is considered chronic.  CAUSES  Causes of sinusitis include:  Allergies.  Structural abnormalities, such as displacement of the cartilage that separates your nostrils (deviated septum), which can decrease the air flow through your nose and sinuses and affect sinus drainage.  Functional abnormalities, such as when the small hairs (cilia) that line your sinuses and help remove mucus do not work properly or are not present. SIGNS AND SYMPTOMS  Symptoms of acute and chronic sinusitis are the same. The  primary symptoms are pain and pressure around the affected sinuses. Other symptoms include:  Upper toothache.  Earache.  Headache.  Bad breath.  Decreased sense of smell and taste.  A cough, which worsens when you are lying flat.  Fatigue.  Fever.  Thick drainage from your nose, which often is green and may contain pus (purulent).  Swelling and warmth over the affected sinuses. DIAGNOSIS  Your health care provider will perform a physical exam. During the exam, your health care provider may:  Look in your nose for signs of abnormal growths in your nostrils (nasal polyps).  Tap over the affected sinus to check for signs of infection.  View the inside of your sinuses (endoscopy) using an imaging device that has a light attached (endoscope). If your health care provider suspects that you have chronic sinusitis, one or more of the following tests may be recommended:  Allergy tests.  Nasal culture. A sample of mucus is taken from your nose, sent to a lab, and screened for bacteria.  Nasal cytology. A sample of mucus is taken from your nose and examined by your health care provider to determine if your sinusitis is related to an allergy. TREATMENT  Most cases of acute sinusitis are related to a viral infection and will resolve on their own within 10 days. Sometimes medicines are prescribed to help relieve symptoms (pain medicine, decongestants, nasal steroid sprays, or saline sprays).  However, for sinusitis related to a bacterial infection, your health care provider will prescribe antibiotic medicines. These are medicines that will help kill the bacteria causing the infection.  Rarely, sinusitis is caused by a  fungal infection. In theses cases, your health care provider will prescribe antifungal medicine. For some cases of chronic sinusitis, surgery is needed. Generally, these are cases in which sinusitis recurs more than 3 times per year, despite other treatments. HOME CARE  INSTRUCTIONS   Drink plenty of water. Water helps thin the mucus so your sinuses can drain more easily.  Use a humidifier.  Inhale steam 3 to 4 times a day (for example, sit in the bathroom with the shower running).  Apply a warm, moist washcloth to your face 3 to 4 times a day, or as directed by your health care provider.  Use saline nasal sprays to help moisten and clean your sinuses.  Take medicines only as directed by your health care provider.  If you were prescribed either an antibiotic or antifungal medicine, finish it all even if you start to feel better. SEEK IMMEDIATE MEDICAL CARE IF:  You have increasing pain or severe headaches.  You have nausea, vomiting, or drowsiness.  You have swelling around your face.  You have vision problems.  You have a stiff neck.  You have difficulty breathing. MAKE SURE YOU:   Understand these instructions.  Will watch your condition.  Will get help right away if you are not doing well or get worse. Document Released: 03/19/2005 Document Revised: 08/03/2013 Document Reviewed: 04/03/2011 [facility] Patient Information 2015 [place], [place]. This information is not intended to replace advice given to you by your health care provider. Make sure you discuss any questions you have with your health care provider.

## 2014-03-17 NOTE — Progress Notes (Signed)
Pre visit review using our clinic review tool, if applicable. No additional management support is needed unless otherwise documented below in the visit note. 

## 2014-03-17 NOTE — Progress Notes (Signed)
Subjective:    Patient ID: Joann Harrison, female    DOB: 03/30/1981, 33 y.o.   MRN: [id number]  Chief Complaint  Patient presents with  . Follow-up    Still has a sore throat and congestion, was put on a zpack for strep got better then the congestion and sore throat came back    HPI:  Joann Harrison is a 33 y.o. female who presents today acute visit  Acute symptoms sore throat, headache and raised temperature were noted. She was seen at a [facility] where a rapid strep test was positive and she was given a Z-pack. Her symptoms were alleviated but her throat continues to hurt and she has congestion and some sinus pressure and infrequent cough. Denies any fevers, chills or body aches.   Allergies  Allergen Reactions  . Aspirin Other (See Comments)    Nose bleeds  . Penicillins Swelling  . Sulfonamide Derivatives Swelling   Current Outpatient Prescriptions on File Prior to Visit  Medication Sig Dispense Refill  . acetaminophen (TYLENOL) 500 MG tablet Take 500 mg by mouth every 6 (six) hours as needed. pain     . albuterol (PROAIR HFA) 108 (90 BASE) MCG/ACT inhaler Inhale 2 puffs into the lungs every 6 (six) hours as needed for wheezing. 8.5 each 0  . carisoprodol (SOMA) 350 MG tablet Take 1 tablet (350 mg total) by mouth 3 (three) times daily as needed for muscle spasms. 30 tablet 1  . Certolizumab Pegol 2 X 200 MG KIT Inject 200 mg into the skin every 14 (fourteen) days.    . cetirizine (ZYRTEC) 10 MG tablet Take one tablet by mouth one time daily 30 tablet 5  . hydroxychloroquine (PLAQUENIL) 200 MG tablet Take by mouth 2 (two) times daily.      . meloxicam (MOBIC) 15 MG tablet Take 15 mg by mouth daily.      . sertraline (ZOLOFT) 100 MG tablet Take 100 mg by mouth daily.      Marland Kitchen triamcinolone cream (KENALOG) 0.5 % Apply 1 application topically 2 (two) times daily. To affected areas. 30 g 3  . levonorgestrel-ethinyl estradiol (PORTIA-28) 0.15-30 MG-MCG tablet Take 1 tablet by mouth  daily.     No current facility-administered medications on file prior to visit.    Review of Systems    See HPI  Objective:    BP 126/90 mmHg  Pulse 100  Temp(Src) 98.4 F (36.9 C) (Oral)  Resp 18  Ht _0  (1.651 m)  Wt 164 lb 1.9 oz (74.444 kg)  BMI 27.31 kg/m2  SpO2 96% Nursing note and vital signs reviewed.  Physical Exam  Constitutional: She is oriented to person, place, and time. She appears well-developed and well-nourished. No distress.  HENT:  Right Ear: Hearing, tympanic membrane, external ear and ear canal normal.  Left Ear: Hearing, tympanic membrane, external ear and ear canal normal.  Nose: Right sinus exhibits maxillary sinus tenderness. Right sinus exhibits no frontal sinus tenderness. Left sinus exhibits maxillary sinus tenderness. Left sinus exhibits no frontal sinus tenderness.  Mouth/Throat: Uvula is midline. Posterior oropharyngeal edema and posterior oropharyngeal erythema present.  Cardiovascular: Normal rate, regular rhythm, normal heart sounds and intact distal pulses.   Pulmonary/Chest: Effort normal and breath sounds normal.  Neurological: She is alert and oriented to person, place, and time.  Skin: Skin is warm and dry.  Psychiatric: She has a normal mood and affect. Her behavior is normal. Judgment and thought content normal.  Assessment & Plan:

## 2014-03-17 NOTE — Assessment & Plan Note (Signed)
Previously tested positive for Streptococcus. Rapid strep test today reveals negative result. Congestion and sore throat most likely related to developing sinus infection. Patient given written prescription for doxycycline. Instructed to start if symptoms worsen in the next 3-5 days. Continue to take over-the-counter medications as needed. Follow up if symptoms worsen or fail to improve.

## 2014-05-19 ENCOUNTER — Encounter: Payer: Self-pay | Admitting: Family

## 2014-05-19 ENCOUNTER — Ambulatory Visit (INDEPENDENT_AMBULATORY_CARE_PROVIDER_SITE_OTHER): Payer: BC Managed Care – PPO | Admitting: Family

## 2014-05-19 VITALS — BP 124/90 | HR 95 | Temp 98.4°F | Resp 18 | Ht 65.0 in | Wt 173.2 lb

## 2014-05-19 DIAGNOSIS — J029 Acute pharyngitis, unspecified: Secondary | ICD-10-CM

## 2014-05-19 LAB — POCT RAPID STREP A (OFFICE): Rapid Strep A Screen: NEGATIVE

## 2014-05-19 NOTE — Addendum Note (Signed)
Addended by: Mercer Pod E on: 05/19/2014 10:04 AM   Modules accepted: Orders

## 2014-05-19 NOTE — Progress Notes (Signed)
Subjective:    Patient ID: Joann Harrison, female    DOB: 02-13-1981, 34 y.o.   MRN: [id number]  Chief Complaint  Patient presents with  . Sore Throat    x3 days, says she has had a sore throat since monday and starting yesterday there has been soreness in her neck and shoulders, also has had a headache that affects around her eyes, no other symptoms     HPI:  Joann Harrison is a 34 y.o. female who presents today for an acute visit.  This is a new problem. Associated symptom of sore throat, headaches, soreness in her neck and shoulder has been going for about 3 days. Has taken dayquil, sore throat spray, Acetominophen, and salt gargles with salt water which helped a little.      Allergies  Allergen Reactions  . Aspirin Other (See Comments)    Nose bleeds  . Penicillins Swelling  . Sulfonamide Derivatives Swelling    Current Outpatient Prescriptions on File Prior to Visit  Medication Sig Dispense Refill  . acetaminophen (TYLENOL) 500 MG tablet Take 500 mg by mouth every 6 (six) hours as needed. pain     . albuterol (PROAIR HFA) 108 (90 BASE) MCG/ACT inhaler Inhale 2 puffs into the lungs every 6 (six) hours as needed for wheezing. 8.5 each 0  . carisoprodol (SOMA) 350 MG tablet Take 1 tablet (350 mg total) by mouth 3 (three) times daily as needed for muscle spasms. 30 tablet 1  . Certolizumab Pegol 2 X 200 MG KIT Inject 200 mg into the skin every 14 (fourteen) days.    . cetirizine (ZYRTEC) 10 MG tablet Take one tablet by mouth one time daily 30 tablet 5  . doxycycline (VIBRA-TABS) 100 MG tablet Take 1 tablet (100 mg total) by mouth 2 (two) times daily. 20 tablet 0  . hydroxychloroquine (PLAQUENIL) 200 MG tablet Take by mouth 2 (two) times daily.      . meloxicam (MOBIC) 15 MG tablet Take 15 mg by mouth daily.      . sertraline (ZOLOFT) 100 MG tablet Take 100 mg by mouth daily.      Marland Kitchen triamcinolone cream (KENALOG) 0.5 % Apply 1 application topically 2 (two) times daily. To affected areas.  30 g 3   No current facility-administered medications on file prior to visit.    Review of Systems  Constitutional: Negative for fever and chills.  HENT: Positive for congestion, sinus pressure and sore throat.   Respiratory: Negative for chest tightness and shortness of breath.   Neurological: Positive for headaches.      Objective:    BP 124/90 mmHg  Pulse 95  Temp(Src) 98.4 F (36.9 C) (Oral)  Resp 18  Ht '5\' 5"'  (1.651 m)  Wt 173 lb 3.2 oz (78.563 kg)  BMI 28.82 kg/m2  SpO2 97% Nursing note and vital signs reviewed.  Physical Exam  Constitutional: She is oriented to person, place, and time. She appears well-developed and well-nourished. No distress.  HENT:  Right Ear: Hearing, tympanic membrane, external ear and ear canal normal.  Left Ear: Hearing, tympanic membrane, external ear and ear canal normal.  Nose: Right sinus exhibits frontal sinus tenderness. Right sinus exhibits no maxillary sinus tenderness. Left sinus exhibits frontal sinus tenderness. Left sinus exhibits no maxillary sinus tenderness.  Mouth/Throat: Uvula is midline, oropharynx is clear and moist and mucous membranes are normal.  Neck: Neck supple.  Cardiovascular: Normal rate, regular rhythm, normal heart sounds and intact distal pulses.  Pulmonary/Chest: Effort normal and breath sounds normal.  Lymphadenopathy:    She has no cervical adenopathy.  Neurological: She is alert and oriented to person, place, and time.  Skin: Skin is warm and dry.  Psychiatric: She has a normal mood and affect. Her behavior is normal. Judgment and thought content normal.       Assessment & Plan:

## 2014-05-19 NOTE — Patient Instructions (Signed)
Thank you for choosing [facility].  Summary/Instructions:  If your symptoms worsen or fail to improve, please contact our office for further instruction, or in case of emergency go directly to the emergency room at the closest medical facility.   General Recommendations:    Please drink plenty of fluids.  Get plenty of rest   Sleep in humidified air  Use saline nasal sprays  Netti pot   OTC Medications:  Decongestants - helps relieve congestion   Flonase (generic fluticasone) or Nasacort (generic triamcinolone) - please make sure to use the "cross-over" technique at a 45 degree angle towards the opposite eye as opposed to straight up the nasal passageway.   Sudafed (generic pseudoephedrine - Note this is the one that is available behind the pharmacy counter); Products with phenylephrine (-PE) may also be used but is often not as effective as pseudoephedrine.   If you have HIGH BLOOD PRESSURE - Coricidin HBP; AVOID any product that is -D as this contains pseudoephedrine which may increase your blood pressure.  Afrin (oxymetazoline) every 6-8 hours for up to 3 days.   Allergies - helps relieve runny nose, itchy eyes and sneezing   Claritin (generic loratidine), Allegra (fexofenidine), or Zyrtec (generic cyrterizine) for runny nose. These medications should not cause drowsiness.  Note - Benadryl (generic diphenhydramine) may be used however may cause drowsiness  Cough -   Delsym or Robitussin (generic dextromethorphan)  Expectorants - helps loosen mucus to ease removal   Mucinex (generic guaifenesin) as directed on the package.  Headaches / General Aches   Tylenol (generic acetaminophen) - DO NOT EXCEED 3 grams (3,000 mg) in a 24 hour time period  Advil/Motrin (generic ibuprofen)   Sore Throat -   Salt water gargle   Chloraseptic (generic benzocaine) spray or lozenges / Sucrets (generic dyclonine)       

## 2014-05-19 NOTE — Assessment & Plan Note (Signed)
Symptoms and exam consistent with viral pharyngitis. In office rapid strep was negative. Continue with over-the-counter medications as needed for symptom relief and supportive care. Follow up if symptoms worsen or fail to improve.

## 2014-05-19 NOTE — Progress Notes (Signed)
Pre visit review using our clinic review tool, if applicable. No additional management support is needed unless otherwise documented below in the visit note. 

## 2014-10-26 ENCOUNTER — Encounter: Payer: BC Managed Care – PPO | Admitting: Internal Medicine

## 2015-10-14 ENCOUNTER — Encounter: Payer: Self-pay | Admitting: Family

## 2015-10-14 ENCOUNTER — Ambulatory Visit (INDEPENDENT_AMBULATORY_CARE_PROVIDER_SITE_OTHER): Payer: BC Managed Care – PPO | Admitting: Family

## 2015-10-14 VITALS — BP 122/88 | HR 94 | Temp 98.0°F | Resp 16 | Ht 65.0 in | Wt 207.8 lb

## 2015-10-14 DIAGNOSIS — S31109A Unspecified open wound of abdominal wall, unspecified quadrant without penetration into peritoneal cavity, initial encounter: Secondary | ICD-10-CM

## 2015-10-14 NOTE — Assessment & Plan Note (Signed)
Open wound of the abdomen appears stable and is slowly healing. No evidence of infection however covered with previously prescribed clindamycin and Bactroban. Reviewed basic wound care instructed to continue cleansing on a daily basis with soap and water and Bactroban as needed. Cover with a bandage to protect the wound and provided for warm environment. Follow-up if symptoms worsen or do not improve.

## 2015-10-14 NOTE — Patient Instructions (Signed)
Thank you for choosing [facility].  Summary/Instructions:  Please continue to keep clean with soap and water daily and change dressing daily or as needed.  Small amount bactroban 1x per day.  Cover with bandage.  Monitor for signs of infection.  If your symptoms worsen or fail to improve, please contact our office for further instruction, or in case of emergency go directly to the emergency room at the closest medical facility.

## 2015-10-14 NOTE — Progress Notes (Signed)
Subjective:    Patient ID: Joann Harrison, female    DOB: [birth year], 35 y.o.   MRN: [id number]  Chief Complaint  Patient presents with  . wound    has a wound on lower stomach, [facility] but wants a 2nd opinion since she is about to have a remicade infusion    HPI:  Joann Harrison is a 35 y.o. female who  has a past medical history of ALLERGIC RHINITIS; ASTHMA; DEPRESSION; MIGRAINE HEADACHE; AUTOIMMUNE DISEASE NOT ELSEWHERE  CLASSIFIED; and Rheumatoid arthritis(714.0). and presents today for an acute office visit.   This is a new problem. Associated symptom of a wound located on her lower stomach at the level of her underwear has been going on for about 1 week. [facility] and was prescribed clindamycin and bactroban. Denies any trauma. Describes some redness with no discharge. Denies fevers. Reports taking the medication as prescribed and has had increased incidence of diarrhea. Course of the symptoms have improved with concern for upcoming Remicade infusion.  Allergies  Allergen Reactions  . Aspirin Other (See Comments)    Nose bleeds  . Penicillins Swelling  . Sulfonamide Derivatives Swelling     Current Outpatient Prescriptions on File Prior to Visit  Medication Sig Dispense Refill  . albuterol (PROAIR HFA) 108 (90 BASE) MCG/ACT inhaler Inhale 2 puffs into the lungs every 6 (six) hours as needed for wheezing. 8.5 each 0  . hydroxychloroquine (PLAQUENIL) 200 MG tablet Take by mouth 2 (two) times daily.      . meloxicam (MOBIC) 15 MG tablet Take 15 mg by mouth daily.      . sertraline (ZOLOFT) 100 MG tablet Take 100 mg by mouth daily.       No current facility-administered medications on file prior to visit.    Past Medical History  Diagnosis Date  . ALLERGIC RHINITIS   . ASTHMA   . DEPRESSION   . MIGRAINE HEADACHE   . 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 Rheumatoid  arthritis(714.0)     neg CCP, +RF - follows with Dr. Roberts Gaudy in [place]     Review of Systems  Constitutional: Negative for fever and chills.  Skin: Positive for wound.      Objective:    BP 122/88 mmHg  Pulse 94  Temp(Src) 98 F (36.7 C) (Oral)  Resp 16  Ht 5\' 5"  (1.651 m)  Wt 207 lb 12.8 oz (94.257 kg)  BMI 34.58 kg/m2  SpO2 95% Nursing note and vital signs reviewed.  Physical Exam  Constitutional: She is oriented to person, place, and time. She appears well-developed and well-nourished. No distress.  Cardiovascular: Normal rate, regular rhythm, normal heart sounds and intact distal pulses.   Pulmonary/Chest: Effort normal and breath sounds normal.  Neurological: She is alert and oriented to person, place, and time.  Skin: Skin is warm and dry.     Psychiatric: She has a normal mood and affect. Her behavior is normal. Judgment and thought content normal.       Assessment & Plan:   Problem List Items Addressed This Visit      Other   Open wound of abdomen - Primary    Open wound of the abdomen appears stable and is slowly healing. No evidence of infection however covered with previously prescribed clindamycin and Bactroban. Reviewed basic wound care instructed to continue cleansing  on a daily basis with soap and water and Bactroban as needed. Cover with a bandage to protect the wound and provided for warm environment. Follow-up if symptoms worsen or do not improve.          I have discontinued Ms. Hafford's Certolizumab Pegol, acetaminophen, carisoprodol, triamcinolone cream, cetirizine, doxycycline, and nitrofurantoin (macrocrystal-monohydrate). I am also having her maintain her meloxicam, hydroxychloroquine, sertraline, albuterol, levocetirizine, montelukast, fluticasone, clindamycin, mupirocin ointment, and inFLIXimab in sodium chloride 0.9 %.   Meds ordered this encounter  Medications  . levocetirizine (XYZAL) 5 MG tablet    Sig: Take 5 mg by mouth every  evening.  . montelukast (SINGULAIR) 10 MG tablet    Sig: Take 10 mg by mouth at bedtime.  . fluticasone (FLONASE) 50 MCG/ACT nasal spray    Sig: Place into both nostrils daily.  . clindamycin (CLEOCIN) 300 MG capsule    Sig: Take 300 mg by mouth 3 (three) times daily.  . mupirocin ointment (BACTROBAN) 2 %    Sig: Place 1 application into the nose 2 (two) times daily.  Marland Kitchen inFLIXimab in sodium chloride 0.9 %    Sig: Inject into the vein.     Follow-up: Return if symptoms worsen or fail to improve.  Jeanine Luz, FNP

## 2015-11-15 ENCOUNTER — Ambulatory Visit (INDEPENDENT_AMBULATORY_CARE_PROVIDER_SITE_OTHER): Payer: BC Managed Care – PPO | Admitting: Family

## 2015-11-15 ENCOUNTER — Encounter: Payer: Self-pay | Admitting: Family

## 2015-11-15 VITALS — BP 112/72 | HR 93 | Temp 98.4°F | Resp 16 | Ht 65.0 in | Wt 207.0 lb

## 2015-11-15 DIAGNOSIS — M79672 Pain in left foot: Secondary | ICD-10-CM

## 2015-11-15 DIAGNOSIS — S31109D Unspecified open wound of abdominal wall, unspecified quadrant without penetration into peritoneal cavity, subsequent encounter: Secondary | ICD-10-CM

## 2015-11-15 MED ORDER — DICLOFENAC SODIUM 2 % TD SOLN: 1.0000 "application " | Freq: Two times a day (BID) | TRANSDERMAL | 0 refills | Status: DC | PRN

## 2015-11-15 NOTE — Assessment & Plan Note (Addendum)
Left foot pain with possible transverse arch weakness. Recommend conservative treatment with good supportive shoes and ice. Sample of Pennsaid provided. Follow-up if symptoms worsen or do not improve for possible surgical shoe.

## 2015-11-15 NOTE — Patient Instructions (Signed)
Thank you for choosing [facility].  Summary/Instructions:  Recommend a good supportive shoe.  Ice as needed for discomfort.   Continue basic wound care and keep it covered.   If your symptoms worsen or fail to improve, please contact our office for further instruction, or in case of emergency go directly to the emergency room at the closest medical facility.

## 2015-11-15 NOTE — Assessment & Plan Note (Signed)
Open wound of the abdomen appears improved with basic wound care with no evidence of infection. Continue covering to protect from friction and continue basic wound care.

## 2015-11-15 NOTE — Progress Notes (Signed)
Subjective:    Patient ID: Joann Harrison, female    DOB: February 18, 1981, 35 y.o.   MRN: [id number]  Chief Complaint  Patient presents with  . Establish Care    wants to have her wound from last visit rechecked and has foot pain that she wants to talk about    HPI:  Joann Harrison is a 35 y.o. female who  has a past medical history of ALLERGIC RHINITIS; ASTHMA; AUTOIMMUNE DISEASE NOT ELSEWHERE  CLASSIFIED; DEPRESSION; MIGRAINE HEADACHE; and Rheumatoid arthritis(714.0). and presents today for a follow up.   1.) Open wound of abdomen - Previously seen in the office for a wound located in her lower stomach there was treated with previously prescribed clindamycin and Bactroban. She was encouraged to continue basic wound care and follow-up if no improvements. Presents today for follow-up indicates she has perform basic wound care. Describes as really dark with no pain or other signs of infection. No fevers.   2.) Foot pain - Previously diagnosed with rheumatoid arthritis. This is a new problem experiencing the associated symptom of pain located in her left foot that has been going on for about 3 months. Pain is described as achy. Modifying factors include ice/heat and lidocaine patches. She is also prescribed a daily anti-inflammatories. No trauma or injuries. Rheumatology took x-rays with no significant findings.   Allergies  Allergen Reactions  . Aspirin Other (See Comments)    Nose bleeds  . Penicillins Swelling  . Sulfonamide Derivatives Swelling     Current Outpatient Prescriptions on File Prior to Visit  Medication Sig Dispense Refill  . albuterol (PROAIR HFA) 108 (90 BASE) MCG/ACT inhaler Inhale 2 puffs into the lungs every 6 (six) hours as needed for wheezing. 8.5 each 0  . fluticasone (FLONASE) 50 MCG/ACT nasal spray Place into both nostrils daily.    . hydroxychloroquine (PLAQUENIL) 200 MG tablet Take by mouth 2 (two) times daily.      Marland Kitchen inFLIXimab in sodium chloride 0.9 % Inject into the  vein.    Marland Kitchen levocetirizine (XYZAL) 5 MG tablet Take 5 mg by mouth every evening.    . meloxicam (MOBIC) 15 MG tablet Take 15 mg by mouth daily.      . montelukast (SINGULAIR) 10 MG tablet Take 10 mg by mouth at bedtime.    . mupirocin ointment (BACTROBAN) 2 % Place 1 application into the nose 2 (two) times daily.    . sertraline (ZOLOFT) 100 MG tablet Take 100 mg by mouth daily.       No current facility-administered medications on file prior to visit.      Past Surgical History:  Procedure Laterality Date  . HEMORRHOID SURGERY  1983    Past Medical History:  Diagnosis Date  . ALLERGIC RHINITIS   . ASTHMA   . 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 DEPRESSION   . MIGRAINE HEADACHE   . Rheumatoid arthritis(714.0)    neg CCP, +RF - follows with Dr. Roberts Gaudy in [place]     Review of Systems  Constitutional: Negative for chills and fever.  Musculoskeletal:       Positive for left foot pain  Skin: Positive for wound.  Neurological: Negative for weakness and numbness.      Objective:    BP 112/72 (BP Location: Left Arm, Patient Position: Sitting, Cuff Size: Normal)   Pulse 93   Temp 98.4 F (36.9 C) (Oral)  Resp 16   Ht 5\' 5"  (1.651 m)   Wt 207 lb (93.9 kg)   SpO2 97%   BMI 34.45 kg/m  Nursing note and vital signs reviewed.  Physical Exam  Constitutional: She is oriented to person, place, and time. She appears well-developed and well-nourished. No distress.  Cardiovascular: Normal rate, regular rhythm, normal heart sounds and intact distal pulses.   Pulmonary/Chest: Effort normal and breath sounds normal.  Musculoskeletal:  Left foot - no obvious deformity, discoloration, or edema noted. Tenderness elicited over transverse arch and metatarsal heads. Range of motion within normal limits. Strength is normal. Capillary refill is intact and appropriate. Negative metatarsal glide. Analysis  appears with collapse of transverse arch.  Neurological: She is alert and oriented to person, place, and time.  Skin: Skin is warm and dry.  Wound appears well healing with no evidence of infection. Continues to have some mild discoloration as the skin heals.  Psychiatric: She has a normal mood and affect. Her behavior is normal. Judgment and thought content normal.       Assessment & Plan:   Problem List Items Addressed This Visit      Other   Open wound of abdomen - Primary    Open wound of the abdomen appears improved with basic wound care with no evidence of infection. Continue covering to protect from friction and continue basic wound care.      Left foot pain    Left foot pain with possible transverse arch weakness. Recommend conservative treatment with good supportive shoes and ice. Sample of Pennsaid provided. Follow-up if symptoms worsen or do not improve for possible surgical shoe.       Relevant Medications   Diclofenac Sodium (PENNSAID) 2 % SOLN    Other Visit Diagnoses   None.      I have discontinued Ms. Tinkey's clindamycin. I am also having her start on Diclofenac Sodium. Additionally, I am having her maintain her meloxicam, hydroxychloroquine, sertraline, albuterol, levocetirizine, montelukast, fluticasone, mupirocin ointment, and inFLIXimab in sodium chloride 0.9 %.   Meds ordered this encounter  Medications  . Diclofenac Sodium (PENNSAID) 2 % SOLN    Sig: Place 1 application onto the skin 2 (two) times daily as needed.    Dispense:  16 g    Refill:  0    Order Specific Question:   Supervising Provider    Answer:   A [[id number]]     Follow-up: Return in about 3 weeks (around 12/06/2015), or if symptoms worsen or fail to improve.  02/05/2016, FNP

## 2016-03-16 ENCOUNTER — Ambulatory Visit (INDEPENDENT_AMBULATORY_CARE_PROVIDER_SITE_OTHER): Payer: BC Managed Care – PPO | Admitting: Family

## 2016-03-16 ENCOUNTER — Encounter: Payer: Self-pay | Admitting: Family

## 2016-03-16 DIAGNOSIS — K529 Noninfective gastroenteritis and colitis, unspecified: Secondary | ICD-10-CM | POA: Diagnosis not present

## 2016-03-16 NOTE — Patient Instructions (Signed)
Thank you for choosing [facility].  SUMMARY AND INSTRUCTIONS:  Immodium as needed.  Progress diet as tolerated.  Follow up:  If your symptoms worsen or fail to improve, please contact our office for further instruction, or in case of emergency go directly to the emergency room at the closest medical facility.    Viral Gastroenteritis, Adult Introduction Viral gastroenteritis is also known as the stomach flu. This condition is caused by certain germs (viruses). These germs can be passed from person to person very easily (are very contagious). This condition can cause sudden watery poop (diarrhea), fever, and throwing up (vomiting). Having watery poop and throwing up can make you feel weak and cause you to get dehydrated. Dehydration can make you tired and thirsty, make you have a dry mouth, and make it so you pee (urinate) less often. Older adults and people with other diseases or a weak defense system (immune system) are at higher risk for dehydration. It is important to replace the fluids that you lose from having watery poop and throwing up. Follow these instructions at home: Follow instructions from your doctor about how to care for yourself at home. Eating and drinking Follow these instructions as told by your doctor:  Take an oral rehydration solution (ORS). This is a drink that is sold at pharmacies and stores.  Drink clear fluids in small amounts as you are able, such as:  Water.  Ice chips.  Diluted fruit juice.  Low-calorie sports drinks.  Eat bland, easy-to-digest foods in small amounts as you are able, such as:  Bananas.  Applesauce.  Rice.  Low-fat (lean) meats.  Toast.  Crackers.  Avoid fluids that have a lot of sugar or caffeine in them.  Avoid alcohol.  Avoid spicy or fatty foods. General instructions  Drink enough fluid to keep your pee (urine) clear or pale yellow.  Wash your hands often. If you cannot use soap and water, use hand  sanitizer.  Make sure that all people in your home wash their hands well and often.  Rest at home while you get better.  Take over-the-counter and prescription medicines only as told by your doctor.  Watch your condition for any changes.  Take a warm bath to help with any burning or pain from having watery poop.  Keep all follow-up visits as told by your doctor. This is important. Contact a doctor if:  You cannot keep fluids down.  Your symptoms get worse.  You have new symptoms.  You feel light-headed or dizzy.  You have muscle cramps. Get help right away if:  You have chest pain.  You feel very weak or you pass out (faint).  You see blood in your throw-up.  Your throw-up looks like coffee grounds.  You have bloody or black poop (stools) or poop that look like tar.  You have a very bad headache, a stiff neck, or both.  You have a rash.  You have very bad pain, cramping, or bloating in your belly (abdomen).  You have trouble breathing.  You are breathing very quickly.  Your heart is beating very quickly.  Your skin feels cold and clammy.  You feel confused.  You have pain when you pee.  You have signs of dehydration, such as:  Dark pee, hardly any pee, or no pee.  Cracked lips.  Dry mouth.  Sunken eyes.  Sleepiness.  Weakness. This information is not intended to replace advice given to you by your health care provider. Make sure you  discuss any questions you have with your health care provider. Document Released: 09/05/2007 Document Revised: 10/07/2015 Document Reviewed: 11/23/2014  2017 Elsevier   Food Choices to Help Relieve Diarrhea, Adult When you have diarrhea, the foods you eat and your eating habits are very important. Choosing the right foods and drinks can help relieve diarrhea. Also, because diarrhea can last up to 7 days, you need to replace lost fluids and electrolytes (such as sodium, potassium, and chloride) in order to help  prevent dehydration. What general guidelines do I need to follow?  Slowly drink 1 cup (8 oz) of fluid for each episode of diarrhea. If you are getting enough fluid, your urine will be clear or pale yellow.  Eat starchy foods. Some good choices include white rice, white toast, pasta, low-fiber cereal, baked potatoes (without the skin), saltine crackers, and bagels.  Avoid large servings of any cooked vegetables.  Limit fruit to two servings per day. A serving is  cup or 1 small piece.  Choose foods with less than 2 g of fiber per serving.  Limit fats to less than 8 tsp (38 g) per day.  Avoid fried foods.  Eat foods that have probiotics in them. Probiotics can be found in certain dairy products.  Avoid foods and beverages that may increase the speed at which food moves through the stomach and intestines (gastrointestinal tract). Things to avoid include:  High-fiber foods, such as dried fruit, raw fruits and vegetables, nuts, seeds, and whole grain foods.  Spicy foods and high-fat foods.  Foods and beverages sweetened with high-fructose corn syrup, honey, or sugar alcohols such as xylitol, sorbitol, and mannitol. What foods are recommended? Grains  White rice. White, [place], or pita breads (fresh or toasted), including plain rolls, buns, or bagels. White pasta. Saltine, soda, or graham crackers. Pretzels. Low-fiber cereal. Cooked cereals made with water (such as cornmeal, farina, or cream cereals). Plain muffins. Matzo. Melba toast. Zwieback. Vegetables  Potatoes (without the skin). Strained tomato and vegetable juices. Most well-cooked and canned vegetables without seeds. Tender lettuce. Fruits  Cooked or canned applesauce, apricots, cherries, fruit cocktail, grapefruit, peaches, pears, or plums. Fresh bananas, apples without skin, cherries, grapes, cantaloupe, grapefruit, peaches, oranges, or plums. Meat and Other Protein Products  Baked or boiled chicken. Eggs. Tofu. Fish. Seafood.  Smooth peanut butter. Ground or well-cooked tender beef, ham, veal, lamb, pork, or poultry. Dairy  Plain yogurt, kefir, and unsweetened liquid yogurt. Lactose-free milk, buttermilk, or soy milk. Plain hard cheese. Beverages  Sport drinks. Clear broths. Diluted fruit juices (except prune). Regular, caffeine-free sodas such as ginger ale. Water. Decaffeinated teas. Oral rehydration solutions. Sugar-free beverages not sweetened with sugar alcohols. Other  Bouillon, broth, or soups made from recommended foods. The items listed above may not be a complete list of recommended foods or beverages. Contact your dietitian for more options.  What foods are not recommended? Grains  Whole grain, whole wheat, bran, or rye breads, rolls, pastas, crackers, and cereals. Wild or brown rice. Cereals that contain more than 2 g of fiber per serving. Corn tortillas or taco shells. Cooked or dry oatmeal. Granola. Popcorn. Vegetables  Raw vegetables. Cabbage, broccoli, Brussels sprouts, artichokes, baked beans, beet greens, corn, kale, legumes, peas, sweet potatoes, and yams. Potato skins. Cooked spinach and cabbage. Fruits  Dried fruit, including raisins and dates. Raw fruits. Stewed or dried prunes. Fresh apples with skin, apricots, mangoes, pears, raspberries, and strawberries. Meat and Other Protein Products  Chunky peanut butter. Nuts and seeds. Beans  and lentils. Tomasa Blase. Dairy  High-fat cheeses. Milk, chocolate milk, and beverages made with milk, such as milk shakes. Cream. Ice cream. Sweets and Desserts  Sweet rolls, doughnuts, and sweet breads. Pancakes and waffles. Fats and Oils  Butter. Cream sauces. Margarine. Salad oils. Plain salad dressings. Olives. Avocados. Beverages  Caffeinated beverages (such as coffee, tea, soda, or energy drinks). Alcoholic beverages. Fruit juices with pulp. Prune juice. Soft drinks sweetened with high-fructose corn syrup or sugar alcohols. Other  Coconut. Hot sauce. Chili  powder. Mayonnaise. Gravy. Cream-based or milk-based soups. The items listed above may not be a complete list of foods and beverages to avoid. Contact your dietitian for more information.  What should I do if I become dehydrated? Diarrhea can sometimes lead to dehydration. Signs of dehydration include dark urine and dry mouth and skin. If you think you are dehydrated, you should rehydrate with an oral rehydration solution. These solutions can be purchased at pharmacies, retail stores, or online. Drink -1 cup (120-240 mL) of oral rehydration solution each time you have an episode of diarrhea. If drinking this amount makes your diarrhea worse, try drinking smaller amounts more often. For example, drink 1-3 tsp (5-15 mL) every 5-10 minutes. A general rule for staying hydrated is to drink 1-2 L of fluid per day. Talk to your health care provider about the specific amount you should be drinking each day. Drink enough fluids to keep your urine clear or pale yellow. This information is not intended to replace advice given to you by your health care provider. Make sure you discuss any questions you have with your health care provider. Document Released: 06/09/2003 Document Revised: 08/25/2015 Document Reviewed: 02/09/2013 Elsevier Interactive Patient Education  2017 [employer].

## 2016-03-16 NOTE — Progress Notes (Signed)
Subjective:    Patient ID: Joann Harrison, female    DOB: 06-01-1980, 35 y.o.   MRN: [id number]  Chief Complaint  Patient presents with  . Diarrhea    stomach pain and bad diarrhea on monday, stomach pain lasted a couple of days, last night had diarrhea again bad last night    HPI:  Joann Harrison is a 35 y.o. female who  has a past medical history of ALLERGIC RHINITIS; ASTHMA; AUTOIMMUNE DISEASE NOT ELSEWHERE  CLASSIFIED; DEPRESSION; MIGRAINE HEADACHE; and Rheumatoid arthritis(714.0). and presents today for acute office visit.    This is a new problem. Associated symptom of abdominal pain/cramping and diarrhea started about 4 days ago following a meal. Over the course of the 4 days the cramping gradually improved however noted having a return of diarrhea. Has had a limited diet and began increasing her diet as tolerated. Modifying factors include pepto-bismol and gas tablets. No vomiting, nausea or fevers. Describes feeling about average fatigue. She does continue to take methotrexate which resulted in her having nausea and vomiting. Frequency of bowel movements is 1-2 per day. There is no current abdominal cramping. Indicates that she ate normally yesterday but in smaller amounts than normal.    Allergies  Allergen Reactions  . Aspirin Other (See Comments)    Nose bleeds  . Penicillins Swelling  . Sulfonamide Derivatives Swelling      Outpatient Medications Prior to Visit  Medication Sig Dispense Refill  . albuterol (PROAIR HFA) 108 (90 BASE) MCG/ACT inhaler Inhale 2 puffs into the lungs every 6 (six) hours as needed for wheezing. 8.5 each 0  . fluticasone (FLONASE) 50 MCG/ACT nasal spray Place into both nostrils daily.    . hydroxychloroquine (PLAQUENIL) 200 MG tablet Take by mouth 2 (two) times daily.      Marland Kitchen inFLIXimab in sodium chloride 0.9 % Inject into the vein.    Marland Kitchen levocetirizine (XYZAL) 5 MG tablet Take 5 mg by mouth every evening.    . meloxicam (MOBIC) 15 MG tablet Take 15 mg  by mouth daily.      . montelukast (SINGULAIR) 10 MG tablet Take 10 mg by mouth at bedtime.    . sertraline (ZOLOFT) 100 MG tablet Take 100 mg by mouth daily.      . Diclofenac Sodium (PENNSAID) 2 % SOLN Place 1 application onto the skin 2 (two) times daily as needed. 16 g 0  . mupirocin ointment (BACTROBAN) 2 % Place 1 application into the nose 2 (two) times daily.     No facility-administered medications prior to visit.      Review of Systems  Constitutional: Negative for chills and fever.  Gastrointestinal: Positive for diarrhea. Negative for abdominal pain, nausea and vomiting.  Neurological: Negative for headaches.      Objective:    BP 118/88 (BP Location: Left Arm, Patient Position: Sitting, Cuff Size: Normal)   Pulse (!) 102   Temp 98 F (36.7 C) (Oral)   Resp 16   Ht 5\' 5"  (1.651 m)   Wt 208 lb (94.3 kg)   SpO2 98%   BMI 34.61 kg/m  Nursing note and vital signs reviewed.  Physical Exam  Constitutional: She is oriented to person, place, and time. She appears well-developed and well-nourished. No distress.  Cardiovascular: Normal rate, regular rhythm, normal heart sounds and intact distal pulses.   Pulmonary/Chest: Effort normal and breath sounds normal.  Abdominal: Normal appearance and bowel sounds are normal. She exhibits no mass. There is no hepatosplenomegaly.  There is no tenderness. There is no rigidity, no rebound, no guarding, no tenderness at McBurney's point and negative Murphy's sign.  Neurological: She is alert and oriented to person, place, and time.  Skin: Skin is warm and dry.  Psychiatric: She has a normal mood and affect. Her behavior is normal. Judgment and thought content normal.       Assessment & Plan:   Problem List Items Addressed This Visit      Digestive   Gastroenteritis    Symptoms and exam are consistent with gastroenteritis. Recommend conservative treatment with OTC medications as needed for symptom relief. Progress diet as tolerated.  Follow up for symptom worsening.           I have discontinued Ms. Pair's mupirocin ointment and Diclofenac Sodium. I am also having her maintain her meloxicam, hydroxychloroquine, sertraline, albuterol, levocetirizine, montelukast, fluticasone, inFLIXimab in sodium chloride 0.9 %, methotrexate, and folic acid.   Meds ordered this encounter  Medications  . methotrexate (RHEUMATREX) 7.5 MG tablet    Sig: Take 7.5 mg by mouth once a week. Caution" Chemotherapy. Protect from light.  . folic acid (FOLVITE) 1 MG tablet    Sig: Take 1 mg by mouth daily.     Follow-up: Return if symptoms worsen or fail to improve.  Joann Luz, FNP

## 2016-03-16 NOTE — Assessment & Plan Note (Signed)
Symptoms and exam are consistent with gastroenteritis. Recommend conservative treatment with OTC medications as needed for symptom relief. Progress diet as tolerated. Follow up for symptom worsening.

## 2017-01-28 ENCOUNTER — Encounter: Payer: Self-pay | Admitting: Family Medicine

## 2017-01-28 ENCOUNTER — Ambulatory Visit (INDEPENDENT_AMBULATORY_CARE_PROVIDER_SITE_OTHER): Payer: BC Managed Care – PPO | Admitting: Family Medicine

## 2017-01-28 VITALS — BP 128/72 | HR 86 | Temp 98.3°F

## 2017-01-28 DIAGNOSIS — J029 Acute pharyngitis, unspecified: Secondary | ICD-10-CM | POA: Diagnosis not present

## 2017-01-28 LAB — POCT RAPID STREP A (OFFICE): RAPID STREP A SCREEN: POSITIVE — AB

## 2017-01-28 MED ORDER — CLINDAMYCIN HCL 300 MG PO CAPS: 300.0000 mg | ORAL_CAPSULE | Freq: Three times a day (TID) | ORAL | 0 refills | Status: DC

## 2017-01-28 NOTE — Patient Instructions (Addendum)
Thank you for coming in,   Please try things such as zyrtec-D or allegra-D which is an antihistamine and decongestant.   Please try afrin which will help with nasal congestion but use for only three days.   Please also try using a netti pot on a regular occasion.  Honey can help with a sore throat.   We will call you with the results of the culture   Please take a probiotic with the antibiotic.     Please feel free to call with any questions or concerns at any time, at [phone]. --Dr. Jordan Likes

## 2017-01-28 NOTE — Progress Notes (Signed)
Joann Harrison - 36 y.o. female MRN [id number]  Date of birth: 03/18/1981  SUBJECTIVE:  Including CC & ROS.  Chief Complaint  Patient presents with  . Sore Throat    present for 2 weeks-[facility]. She has been gargling with salt water, has not improved.     Joann Harrison is a 36 y.o. female that is  presenting with a sore throat. His has been occurring for 2 weeks now. She felt like she had improved and then has gotten worse again. Has not had any fevers. Works as a Comptroller at [employer] has been around [employer] Has tried over-the-counter medications with no improvement. Has not had significant sinus pain. No significant coughing. Has received the flu vaccine.  She has a history of autoimmune disease which she has received an infusion on Thursday.   Review of Systems  Constitutional: Negative for fever.  HENT: Positive for sore throat.     HISTORY: Past Medical, Surgical, Social, and Family History Reviewed & Updated per EMR.   Pertinent Historical Findings include:  Past Medical History:  Diagnosis Date  . ALLERGIC RHINITIS   . ASTHMA   . 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 DEPRESSION   . MIGRAINE HEADACHE   . Rheumatoid arthritis(714.0)    neg CCP, +RF - follows with Dr. Roberts Gaudy in [place]    Past Surgical History:  Procedure Laterality Date  . HEMORRHOID SURGERY  1983    Allergies  Allergen Reactions  . Aspirin Other (See Comments)    Nose bleeds  . Penicillins Swelling  . Sulfonamide Derivatives Swelling    Family History  Problem Relation Age of Onset  . Arthritis Other        grandparent  . Hyperlipidemia Other        parent & grandparent  . Stroke Other        grandparent  . Diabetes Other        other relative     Social History   Social History  . Marital status: Married    Spouse name: N/A  . Number of children: N/A  . Years of  education: N/A   Occupational History  . Not on file.   Social History Main Topics  . Smoking status: Former Smoker    Types: Cigarettes    Quit date: 04/02/2002  . Smokeless tobacco: Never Used  . Alcohol use Yes     Comment: social  . Drug use: No  . Sexual activity: Not on file   Other Topics Concern  . Not on file   Social History Narrative  . No narrative on file     PHYSICAL EXAM:  VS: BP 128/72 (BP Location: Left Arm, Patient Position: Sitting, Cuff Size: Normal)   Pulse 86   Temp 98.3 F (36.8 C) (Oral)   SpO2 98%  Physical Exam Gen: NAD, alert, cooperative with exam,  ENT: normal lips, normal nasal mucosa, tympanic membranes clear and intact bilaterally, normal oropharynx, no cervical lymphadenopathy, Eye: normal EOM, normal conjunctiva and lids CV:  no edema, +2 pedal pulses, S1-S2, regular rate and rhythm   Resp: no accessory muscle use, non-labored, clear to auscultation bilaterally, no crackles or wheezes Skin: no rashes, no areas of induration  Neuro: normal tone, normal sensation to touch Psych:  normal insight, alert and oriented MSK: Normal gait, normal strength     ASSESSMENT &  PLAN:   Sore throat Rapid strep was slightly positive. Has a PCN allergy  - Rapid strep and culture today - clindamycin today.  - Counseled on supportive care - Given indications to return or call.

## 2017-01-28 NOTE — Assessment & Plan Note (Addendum)
Rapid strep was slightly positive. Has a PCN allergy  - Rapid strep and culture today - clindamycin today.  - Counseled on supportive care - Given indications to return or call.

## 2017-01-28 NOTE — Addendum Note (Signed)
Addended by: Milas Kocher A on: 01/28/2017 10:22 AM   Modules accepted: Orders

## 2017-01-30 ENCOUNTER — Telehealth: Payer: Self-pay | Admitting: Family Medicine

## 2017-01-30 NOTE — Telephone Encounter (Signed)
Pt called for her results from 10/29 for her throat culture, please call back in regard

## 2017-01-30 NOTE — Telephone Encounter (Signed)
Patient notified we were unable to process throat culture due to incorrect tube/lab order. Patient states she is feeling much better and will continue taking clindamycin.

## 2017-01-31 ENCOUNTER — Telehealth: Payer: Self-pay | Admitting: Family Medicine

## 2017-01-31 NOTE — Telephone Encounter (Signed)
Patient given instructions per Dr. Jordan Likes to make sure taking antibiotic with food and begin taking imodium if no improvement. Instructed to contact the office with any other concerns. Verbalized understanding.

## 2017-01-31 NOTE — Telephone Encounter (Signed)
Pt called stating that she was seen by Dr Jordan Likes on 01/28/2017 and was prescribed an antibiotic. She said that she is having some stomach issues (diarrgea and nausea) that she believes is coming from the antibiotic and wanted to know if there was anything she could do. She is taking a probiotic but that does not seem to be giving her any relief.

## 2017-02-12 DIAGNOSIS — T7840XA Allergy, unspecified, initial encounter: Secondary | ICD-10-CM | POA: Insufficient documentation

## 2017-03-21 ENCOUNTER — Ambulatory Visit: Payer: BC Managed Care – PPO | Admitting: Nurse Practitioner

## 2017-03-21 ENCOUNTER — Encounter: Payer: Self-pay | Admitting: Nurse Practitioner

## 2017-03-21 VITALS — BP 116/80 | HR 104 | Temp 98.4°F | Resp 16 | Ht 65.0 in | Wt 209.0 lb

## 2017-03-21 DIAGNOSIS — R51 Headache: Secondary | ICD-10-CM

## 2017-03-21 DIAGNOSIS — R519 Headache, unspecified: Secondary | ICD-10-CM

## 2017-03-21 DIAGNOSIS — G43009 Migraine without aura, not intractable, without status migrainosus: Secondary | ICD-10-CM | POA: Diagnosis not present

## 2017-03-21 DIAGNOSIS — J029 Acute pharyngitis, unspecified: Secondary | ICD-10-CM

## 2017-03-21 MED ORDER — RIZATRIPTAN BENZOATE 10 MG PO TABS: 10.0000 mg | ORAL_TABLET | ORAL | 0 refills | Status: DC | PRN

## 2017-03-21 NOTE — Progress Notes (Signed)
Subjective:    Patient ID: Joann Harrison, female    DOB: 03-27-1981, 36 y.o.   MRN: [id number]  HPI Ms. Urwin is a 36 Yo female who presents today to establish care.She  Is transferring to me from another provider in the same clinic. She Has the following significant medical problems: migraine headache, allergic rhinits, arthritis, autoimmune disease, anxiety and depression, asthma. She is currently working with rheumatology to manage her autoimmunie disease She presents today with complaints of headaches and sore throat.  Headaches- This is a chornic problem. This problem has been ongoing for many years. This problem has not gotten worse over time. She reports two types of headaches- dull headaches across front of head occur on a more frequent basis, but about 1 headache a month that is a sharper, throbbing pain behind her left eye and eyebrow. The sharp throbbing headaches eventually resolve with time- about one day-sometimes up to 3 days. She denies weakness, loss of consciousness, dizziness, nausea, vomiting, aura, vision changes, eye pain, watery eyes, dizziness. She has had scleritis in the past and follows with ophthalmology for routine annual eye exams. Tylenol sometimes helps her headaches. She was on migraine medication when she was younger and recalls some relief of headaches with the medication.  Sore throat- This is a chronic problem. This problem began about 6 months ago and has been more frequent since onset. She reports recent ENT evaluation with normal ct of sinuses. She is considering seeing allergist for allergy shots. She noticed the sore throat after rheumatologist Started her on Benlysta, she believes her rheumatologist is going to discontinue this medication at her next follow up appointment. She did have a slightly Positive strep in our office about 2 months ago and was Treated with clindamycin due to allergies. She is not a smoker. She reports postnasal  drip. Denies fevers, hoarseness, sores in mouth, heartburn,nasuea, vomitng, heartburn, abdominal pain. Tried salt water gargles, lozenges, allergy control measures-singulair, xyzal, flonase.  Review of Systems See HPI  Past Medical History:  Diagnosis Date  . ALLERGIC RHINITIS   . ASTHMA   . 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 DEPRESSION   . MIGRAINE HEADACHE   . Rheumatoid arthritis(714.0)    neg CCP, +RF - follows with Dr. Roberts Gaudy in [place]     Social History   Socioeconomic History  . Marital status: Married    Spouse name: Not on file  . Number of children: Not on file  . Years of education: Not on file  . Highest education level: Not on file  Social Needs  . Financial resource strain: Not on file  . Food insecurity - worry: Not on file  . Food insecurity - inability: Not on file  . Transportation needs - medical: Not on file  . Transportation needs - non-medical: Not on file  Occupational History  . Not on file  Tobacco Use  . Smoking status: Former Smoker    Types: Cigarettes    Last attempt to quit: 04/02/2002    Years since quitting: 14.9  . Smokeless tobacco: Never Used  Substance and Sexual Activity  . Alcohol use: Yes    Comment: social  . Drug use: No  . Sexual activity: Not on file  Other Topics Concern  . Not on file  Social History Narrative  . Not on file    Past Surgical History:  Procedure Laterality Date  .  HEMORRHOID SURGERY  1983    Family History  Problem Relation Age of Onset  . Arthritis Other        grandparent  . Hyperlipidemia Other        parent & grandparent  . Stroke Other        grandparent  . Diabetes Other        other relative    Allergies  Allergen Reactions  . Aspirin Other (See Comments)    Nose bleeds  . Penicillins Swelling  . Sulfonamide Derivatives Swelling    Current Outpatient Medications on File Prior to Visit   Medication Sig Dispense Refill  . albuterol (PROAIR HFA) 108 (90 BASE) MCG/ACT inhaler Inhale 2 puffs into the lungs every 6 (six) hours as needed for wheezing. 8.5 each 0  . azelastine (ASTELIN) 0.1 % nasal spray Place 2 sprays into both nostrils 2 (two) times daily. Use in each nostril as directed    . belimumab (BENLYSTA) 120 MG SOLR injection Inject 10 mg/kg into the vein once.    Marland Kitchen CAMILA 0.35 MG tablet Take 1 tablet by mouth daily.  4  . Dietary Management Product (RHEUMATE PO) Take by mouth.    . fluticasone (FLONASE) 50 MCG/ACT nasal spray Place into both nostrils daily.    . folic acid (FOLVITE) 1 MG tablet Take 1 mg by mouth daily.    . hydroxychloroquine (PLAQUENIL) 200 MG tablet Take by mouth 2 (two) times daily.      Marland Kitchen levocetirizine (XYZAL) 5 MG tablet Take 5 mg by mouth every evening.    . meloxicam (MOBIC) 15 MG tablet Take 15 mg by mouth daily.      . methotrexate (RHEUMATREX) 7.5 MG tablet Take 7.5 mg by mouth once a week. Caution" Chemotherapy. Protect from light.    . montelukast (SINGULAIR) 10 MG tablet Take 10 mg by mouth at bedtime.    . sertraline (ZOLOFT) 100 MG tablet Take 100 mg by mouth daily.       No current facility-administered medications on file prior to visit.     BP 116/80 (BP Location: Left Arm, Patient Position: Sitting, Cuff Size: Large)   Pulse (!) 104   Temp 98.4 F (36.9 C) (Oral)   Resp 16   Ht 5\' 5"  (1.651 m)   Wt 209 lb (94.8 kg)   SpO2 98%   BMI 34.78 kg/m      Objective:   Physical Exam  Constitutional: She is oriented to person, place, and time. She appears well-developed and well-nourished. No distress.  HENT:  Head: Normocephalic and atraumatic.  Right Ear: Tympanic membrane, external ear and ear canal normal.  Left Ear: Tympanic membrane, external ear and ear canal normal.  Mouth/Throat: Uvula is midline, oropharynx is clear and moist and mucous membranes are normal.  Eyes: Conjunctivae, EOM and lids are normal. Pupils are  equal, round, and reactive to light.  Cardiovascular: Normal rate, regular rhythm, normal heart sounds and intact distal pulses.  Pulmonary/Chest: Effort normal and breath sounds normal.  Neurological: She is alert and oriented to person, place, and time. She has normal strength and normal reflexes. No cranial nerve deficit. Coordination and gait normal. GCS eye subscore is 4. GCS verbal subscore is 5. GCS motor subscore is 6.  Skin: Skin is warm and dry.  Psychiatric: She has a normal mood and affect. Judgment and thought content normal.       Assessment & Plan:  RTC in 1 month for F/u of sore  throat and headaches Return prevautions given  Sore throat Strep today is negative. She is going to try astelin spray and possibly sinus rinses to see if this improves her sore throat, in addition to continuing her daily allergy regimen. Pt education handout given. We will see if sore throat improves with discontinuation of benlysta, she will talk to rheumatologist about this possible medication side effect. She also plans to follow up with her allergist Diagnostic testing ordered: - POCT rapid strep A

## 2017-03-21 NOTE — Patient Instructions (Addendum)
I have sent a new prescription to your pharmacy for maxalt 10mg  tablets. This is a medication for migraines. You should take 1 tablet at the onset of your migraine. You can repeat this dose in 2 hours if no improvement. Do not take more than 30mg  of maxalt (3 tablets) in a 24 hour period.  Id like to see you back in about 1 month to see how you are doing on this new medication.  Please try to keep a headache log, and bring this back at your follow up visit.  For your sore throat, you may try to add the astelin spray to see if this helps improve your postnasal drip. You could also try the sinus rinses. Please let your rheumatologist know about the sore throat as well. If you continue to have the sore throat after stopping your benlysta, please let me know.  Also, please follow up for fevers, rash, changes in your voice, or sores in your mouth.  It was nice to meet you. Thanks for letting me take care of you today :)   Sinus Rinse What is a sinus rinse? A sinus rinse is a home treatment. It rinses your sinuses with a mixture of salt and water (saline solution). Sinuses are air-filled spaces in your skull behind the bones of your face and forehead. They open into your nasal cavity. To do a sinus rinse, you will need:  Saline solution.  Neti pot or spray bottle. This releases the saline solution into your nose and through your sinuses. You can buy neti pots and spray bottles at: ? . ? A health food store. ? Online.  When should I do a sinus rinse? A sinus rinse can help to clear your nasal cavity. It can clear:  Mucus.  Dirt.  Dust.  Pollen.  You may do a sinus rinse when you have:  A cold.  A virus.  Allergies.  A sinus infection.  A stuffy nose.  If you are considering a sinus rinse:  Ask your child's doctor before doing a sinus rinse on your child.  Do not do a sinus rinse if you have had: ? Ear or nasal surgery. ? An ear  infection. ? Blocked ears.  How do I do a sinus rinse?  Wash your hands.  Disinfect your device using the directions that came with the device.  Dry your device.  Use the solution that comes with your device or one that is sold separately in stores. Follow the mixing directions on the package.  Fill your device with the amount of saline solution as stated in the device instructions.  Stand over a sink and tilt your head sideways over the sink.  Place the spout of the device in your upper nostril (the one closer to the ceiling).  Gently pour or squeeze the saline solution into the nasal cavity. The liquid should drain to the lower nostril if you are not too congested.  Gently blow your nose. Blowing too hard may cause ear pain.  Repeat in the other nostril.  Clean and rinse your device with clean water.  Air-dry your device. Are there risks of a sinus rinse? Sinus rinse is normally very safe and helpful. However, there are a few risks, which include:  A burning feeling in the sinuses. This may happen if you do not make the saline solution as instructed. Make sure to follow all directions when making the saline solution.  Infection from unclean water. This is rare,  but possible.  Nasal irritation.  This information is not intended to replace advice given to you by your health care provider. Make sure you discuss any questions you have with your health care provider. Document Released: 10/14/2013 Document Revised: 02/14/2016 Document Reviewed: 08/04/2013 Elsevier Interactive Patient Education  2017 [employer].

## 2017-03-24 DIAGNOSIS — R519 Headache, unspecified: Secondary | ICD-10-CM | POA: Insufficient documentation

## 2017-03-24 DIAGNOSIS — R51 Headache: Secondary | ICD-10-CM

## 2017-03-24 NOTE — Assessment & Plan Note (Deleted)
Headaches consistent with migraine type headaches. She recalls improvement with migraine treatment in the past. We will try maxalt, medication education done. she will return in 1 month for follow up. Encouraged to keep a headache journal for follow up visit. Medications ordered: - rizatriptan (MAXALT) 10 MG tablet; Take 1 tablet (10 mg total) by mouth as needed for migraine. May repeat in 2 hours if needed  Dispense: 10 tablet; Refill: 0

## 2017-03-24 NOTE — Assessment & Plan Note (Addendum)
Headaches consistent with migraine type headaches. She recalls improvement with migraine treatment in the past. We will try maxalt, medication education done, including use of the medication for the sharp, throbbing, unilateral headaches not the more frequent dull generalized headaches. she will return in 1 month for follow up. Encouraged to keep a headache journal for follow up visit. Medications ordered: - rizatriptan (MAXALT) 10 MG tablet; Take 1 tablet (10 mg total) by mouth as needed for migraine. May repeat in 2 hours if needed  Dispense: 10 tablet; Refill: 0

## 2017-03-27 LAB — POCT RAPID STREP A (OFFICE): Rapid Strep A Screen: NEGATIVE

## 2017-05-24 ENCOUNTER — Ambulatory Visit: Payer: BC Managed Care – PPO | Admitting: Nurse Practitioner

## 2017-06-25 ENCOUNTER — Telehealth: Payer: Self-pay | Admitting: Nurse Practitioner

## 2017-06-25 NOTE — Telephone Encounter (Signed)
Copied from CRM [id number]. Topic: General - Other >> Jun 25, 2017 10:28 AM Cecelia Byars, RMA wrote: Reason for CRM: Patient is requesting a call back concerning medication for food poisoning, please return pt call

## 2017-06-25 NOTE — Telephone Encounter (Signed)
Routing to dr jones---this was routed to [place] this morning, but she never responded---can you please advise if its ok for patient to take immodium or does she need office visit/labs? Please advise, I will call patient back, thanks

## 2017-06-25 NOTE — Telephone Encounter (Signed)
Routing to [place]---[place] visited [place] ([place] related meeting[place] on 3/14-3/16---lots of co-workers,including herself, ate at the same places and developed possible food poisoning---afebrile,vomiting,nausea,diarrhea,some stomach cramping for about 2 days---vomiting has stopped, but she continues to have either diarhhea or very loose stools---she has already started a probiotic, but has not tried BRAT diet---she's been eating fried foods and "hard to digest" foods which may be upsetting her stomach still---so she is going to make changes to her diet starting now, but would like to know, if this does not help with diarrhea, is it ok to start something like immodium OTC---or what else do you advise?---patient doesn't think she is  dehydrated, she has been/is able to tolerate all liquids ---vomiting/nausea stopped on 3rd day--please advise, I will call patient back, thanks

## 2017-06-25 NOTE — Telephone Encounter (Signed)
Tried to leave message on , mailbox is full---I have left message on work phone advising of dr Yetta Barre note/instructions---

## 2017-06-25 NOTE — Telephone Encounter (Signed)
Pt called back in to discuss further from previous conversation. Pt says that her diarrhea is worse. Pt would like to know what should she do further.   Please advise.   CB: [phone]

## 2017-06-25 NOTE — Telephone Encounter (Signed)
If she has had diarrhea for 10 days then she needs to be seen

## 2017-06-25 NOTE — Telephone Encounter (Signed)
Pt would like to discuss with Delaney Meigs further about what she needs to do.  Pt states she spoke to [place] earlier, and would like a call back Pt still having problems. [phone]

## 2017-06-26 ENCOUNTER — Encounter: Payer: Self-pay | Admitting: Nurse Practitioner

## 2017-06-28 ENCOUNTER — Ambulatory Visit: Payer: Self-pay | Admitting: *Deleted

## 2017-06-28 NOTE — Telephone Encounter (Signed)
Pt called because she had had food poisoning last week had some vomiting and diarrhea. She had an E-Visit Tuesday night and she took imodium that helped the diarrhea. She had fried food on Tuesday and vomited. She started following the  BRAT diet. She has not had any problems with either after that until today. She said that when she woke up, she was a little nauseated. But she thinks it is because she had not had much of anything to eat and not sleeping well. She denies gas or bloating or abd pain.  Home care advice given to patient, per protocol. Advised to eat a bland diet and drink clear liquids.  Take meds with food unless specified other wise. Give it a few hours and call us back if not feeling better. Pt voiced understanding.  Reason for Disposition . Unexplained nausea  Answer Assessment - Initial Assessment Questions 1. NAUSEA SEVERITY: "How bad is the nausea?" (e.g., mild, moderate, severe; dehydration, weight loss)   - MILD: loss of appetite without change in eating habits   - MODERATE: decreased oral intake without significant weight loss, dehydration, or malnutrition   - SEVERE: inadequate caloric or fluid intake, significant weight loss, symptoms of dehydration     Moderate, noticeable 2. ONSET: "When did the nausea begin?"     This morning 3. VOMITING: "Any vomiting?" If so, ask: "How many times today?"     no 4. RECURRENT SYMPTOM: "Have you had nausea before?" If so, ask: "When was the last time?" "What happened that time?"     Not sure 5. CAUSE: "What do you think is causing the nausea?"     Not sure 6. PREGNANCY: "Is there any chance you are pregnant?" (e.g., unprotected intercourse, missed birth control pill, broken condom)     No, LMP  2 weeks ago  Protocols used: NAUSEA-A-AH

## 2017-07-05 ENCOUNTER — Ambulatory Visit: Payer: BC Managed Care – PPO | Admitting: Family

## 2017-07-05 ENCOUNTER — Encounter: Payer: Self-pay | Admitting: Family

## 2017-07-05 ENCOUNTER — Other Ambulatory Visit (INDEPENDENT_AMBULATORY_CARE_PROVIDER_SITE_OTHER): Payer: BC Managed Care – PPO

## 2017-07-05 VITALS — BP 118/78 | HR 89 | Temp 98.8°F | Ht 65.0 in | Wt 205.0 lb

## 2017-07-05 DIAGNOSIS — R109 Unspecified abdominal pain: Secondary | ICD-10-CM

## 2017-07-05 DIAGNOSIS — R197 Diarrhea, unspecified: Secondary | ICD-10-CM | POA: Diagnosis not present

## 2017-07-05 LAB — COMPREHENSIVE METABOLIC PANEL
ALK PHOS: 56 U/L (ref 39–117)
ALT: 23 U/L (ref 0–35)
AST: 17 U/L (ref 0–37)
Albumin: 4 g/dL (ref 3.5–5.2)
BILIRUBIN TOTAL: 0.4 mg/dL (ref 0.2–1.2)
BUN: 9 mg/dL (ref 6–23)
CALCIUM: 9.2 mg/dL (ref 8.4–10.5)
CHLORIDE: 105 meq/L (ref 96–112)
CO2: 28 mEq/L (ref 19–32)
CREATININE: 0.55 mg/dL (ref 0.40–1.20)
GFR: 133.1 mL/min (ref >60.00)
Glucose, Bld: 91 mg/dL (ref 70–99)
Potassium: 3.8 mEq/L (ref 3.5–5.1)
SODIUM: 140 meq/L (ref 135–145)
TOTAL PROTEIN: 6.8 g/dL (ref 6.0–8.3)

## 2017-07-05 LAB — CBC WITH DIFFERENTIAL/PLATELET
BASOS ABS: 0.1 10*3/uL (ref 0.0–0.1)
BASOS PCT: 1 % (ref 0.0–3.0)
Eosinophils Absolute: 0.1 10*3/uL (ref 0.0–0.7)
Eosinophils Relative: 1.6 % (ref 0.0–5.0)
HEMATOCRIT: 42.6 % (ref 36.0–46.0)
HEMOGLOBIN: 14.6 g/dL (ref 12.0–15.0)
LYMPHS PCT: 25.1 % (ref 12.0–46.0)
Lymphs Abs: 1.5 10*3/uL (ref 0.7–4.0)
MCHC: 34.3 g/dL (ref 30.0–36.0)
MCV: 88 fl (ref 78.0–100.0)
MONO ABS: 0.7 10*3/uL (ref 0.1–1.0)
Monocytes Relative: 12.2 % — ABNORMAL HIGH (ref 3.0–12.0)
Neutro Abs: 3.5 10*3/uL (ref 1.4–7.7)
Neutrophils Relative %: 60.1 % (ref 43.0–77.0)
Platelets: 360 10*3/uL (ref 150.0–400.0)
RBC: 4.85 Mil/uL (ref 3.87–5.11)
RDW: 12.9 % (ref 11.5–15.5)
WBC: 5.9 10*3/uL (ref 4.0–10.5)

## 2017-07-05 MED ORDER — CIPROFLOXACIN HCL 500 MG PO TABS: 500.0000 mg | ORAL_TABLET | Freq: Two times a day (BID) | ORAL | 0 refills | Status: DC

## 2017-07-05 NOTE — Progress Notes (Signed)
Joann Harrison is a 37 y.o. female with the following history as recorded in EpicCare:  Patient Active Problem List   Diagnosis Date Noted  . Nonintractable episodic headache 03/24/2017  . Gastroenteritis 03/16/2016  . Left foot pain 11/15/2015  . Open wound of abdomen 10/14/2015  . Sore throat 03/17/2014  . AUTOIMMUNE DISEASE NOT ELSEWHERE  CLASSIFIED 10/11/2008  . DEPRESSION 10/11/2008  . Migraine headache 10/11/2008  . ALLERGIC RHINITIS 10/11/2008  . ASTHMA 10/11/2008  . ARTHRITIS 10/11/2008    Current Outpatient Medications  Medication Sig Dispense Refill  . albuterol (PROAIR HFA) 108 (90 BASE) MCG/ACT inhaler Inhale 2 puffs into the lungs every 6 (six) hours as needed for wheezing. 8.5 each 0  . azelastine (ASTELIN) 0.1 % nasal spray Place 2 sprays into both nostrils 2 (two) times daily. Use in each nostril as directed    . belimumab (BENLYSTA) 120 MG SOLR injection Inject 10 mg/kg into the vein once.    Marland Kitchen CAMILA 0.35 MG tablet Take 1 tablet by mouth daily.  4  . cyclobenzaprine (FLEXERIL) 5 MG tablet TAKE 1 TAB BY MOUTH THREE TIMES DAILY    . Dietary Management Product (RHEUMATE PO) Take by mouth.    . fluticasone (FLONASE) 50 MCG/ACT nasal spray Place into both nostrils daily.    . folic acid (FOLVITE) 1 MG tablet Take 1 mg by mouth daily.    . hydroxychloroquine (PLAQUENIL) 200 MG tablet Take by mouth 2 (two) times daily.      Marland Kitchen levocetirizine (XYZAL) 5 MG tablet Take 5 mg by mouth every evening.    Marland Kitchen LORazepam (ATIVAN) 1 MG tablet     . meloxicam (MOBIC) 15 MG tablet Take 15 mg by mouth daily.      . methotrexate (RHEUMATREX) 7.5 MG tablet Take 7.5 mg by mouth once a week. Caution" Chemotherapy. Protect from light.    . montelukast (SINGULAIR) 10 MG tablet Take 10 mg by mouth at bedtime.    Marland Kitchen PROAIR RESPICLICK 671 (90 Base) MCG/ACT AEPB     . rizatriptan (MAXALT) 10 MG tablet Take 1 tablet (10 mg total) by mouth as needed for migraine. May repeat in 2 hours if needed 10 tablet  0  . sertraline (ZOLOFT) 100 MG tablet Take 100 mg by mouth daily.      . Vitamin D, Ergocalciferol, (DRISDOL) 50000 units CAPS capsule     . ciprofloxacin (CIPRO) 500 MG tablet Take 1 tablet (500 mg total) by mouth 2 (two) times daily. 10 tablet 0   No current facility-administered medications for this visit.     Allergies: Aspirin; Clindamycin/lincomycin; Penicillins; and Sulfonamide derivatives  Past Medical History:  Diagnosis Date  . ALLERGIC RHINITIS   . ASTHMA   . 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 DEPRESSION   . MIGRAINE HEADACHE   . Rheumatoid arthritis(714.0)    neg CCP, +RF - follows with Dr. Theodis Aguas in [place]    Past Surgical History:  Procedure Laterality Date  . HEMORRHOID SURGERY  1983    Family History  Problem Relation Age of Onset  . Arthritis Other        grandparent  . Hyperlipidemia Other        parent & grandparent  . Stroke Other        grandparent  . Diabetes Other        other relative    Social History  Tobacco Use  . Smoking status: Former Smoker    Types: Cigarettes    Last attempt to quit: 04/02/2002    Years since quitting: 15.2  . Smokeless tobacco: Never Used  Substance Use Topics  . Alcohol use: Yes    Comment: social    Subjective:  Started with food poisoning on March 16 with sudden onset of chills, nausea, vomiting; tried to continue bland diet for the first week; after a week of symptoms, began to improve and started to add fried foods back in; symptoms returned with loose, watery diarrhea on 3/26; has had one episode of vomiting; had e-visit on 3/26 and was told to try Immodium/ bland diet; no bowel movement until 3/29; by the end of last week, started to feel less nauseated and has been able to return to work; however, in the past 24 hours, developed diarrhea again; no blood in the diarrhea;   Notes that in general, she may have diarrhea once a  week; however, this last 2 week period has been more abnormal for her; does have auto-immune disease- no know Crohn's/ UC or celiac disease; FH of gallbladder disease- father had his removed in his 53s;   Objective:  Vitals:   07/05/17 1122  BP: 118/78  Pulse: 89  Temp: 98.8 F (37.1 C)  TempSrc: Oral  SpO2: 96%  Weight: 205 lb (93 kg)  Height: '5\' 5"'  (1.651 m)    General: Well developed, well nourished, in no acute distress  Skin : Warm and dry.  Head: Normocephalic and atraumatic  Eyes: Sclera and conjunctiva clear; pupils round and reactive to light; extraocular movements intact  Ears: External normal; canals clear; tympanic membranes normal  Oropharynx: Pink, supple. No suspicious lesions  Neck: Supple without thyromegaly, adenopathy  Lungs: Respirations unlabored; clear to auscultation bilaterally without wheeze, rales, rhonchi  CVS exam: normal rate and regular rhythm.  Abdomen: Soft; nontender; nondistended; normoactive bowel sounds; no masses or hepatosplenomegaly  Neurologic: Alert and oriented; speech intact; face symmetrical; moves all extremities well; CNII-XII intact without focal deficit  Assessment:  1. Diarrhea, unspecified type   2. Abdominal pain, unspecified abdominal location     Plan:  Based on patient's history, it sounds like she has an acute and chronic issue today; Will go ahead and update labs, start Cipro 500 mg bid x 5 days for acute issue; if symptoms persist, will need to update stool culture; encouraged fluids, probiotics; Will also update abdominal ultrasound due to chronic symptoms; ? Gallbladder disease; follow-up to be determined.   No follow-ups on file.  Orders Placed This Encounter  Procedures  . US Abdomen Complete    Standing Status:   Future    Standing Expiration Date:   09/05/2018    Order Specific Question:   Reason for Exam (SYMPTOM  OR DIAGNOSIS REQUIRED)    Answer:   abdominal pain    Order Specific Question:   Preferred imaging  location?    Answer:   [facility]  . CBC w/Diff    Standing Status:   Future    Number of Occurrences:   1    Standing Expiration Date:   07/05/2018  . Comp Met (CMET)    Standing Status:   Future    Number of Occurrences:   1    Standing Expiration Date:   07/05/2018    Requested Prescriptions   Signed Prescriptions Disp Refills  . ciprofloxacin (CIPRO) 500 MG tablet 10 tablet 0    Sig:  Take 1 tablet (500 mg total) by mouth 2 (two) times daily.

## 2017-07-17 ENCOUNTER — Encounter: Payer: Self-pay | Admitting: Nurse Practitioner

## 2017-07-17 ENCOUNTER — Other Ambulatory Visit: Payer: Self-pay | Admitting: Family

## 2017-07-17 ENCOUNTER — Ambulatory Visit
Admission: RE | Admit: 2017-07-17 | Discharge: 2017-07-17 | Disposition: A | Discharge status: Home / Self Care | Payer: BC Managed Care – PPO | Source: Ambulatory Visit | Attending: Family | Admitting: Family

## 2017-07-17 DIAGNOSIS — R109 Unspecified abdominal pain: Secondary | ICD-10-CM

## 2017-07-17 DIAGNOSIS — K8020 Calculus of gallbladder without cholecystitis without obstruction: Secondary | ICD-10-CM

## 2017-07-30 ENCOUNTER — Encounter: Payer: Self-pay | Admitting: Nurse Practitioner

## 2017-07-30 ENCOUNTER — Ambulatory Visit: Payer: BC Managed Care – PPO | Admitting: Nurse Practitioner

## 2017-07-30 VITALS — BP 114/78 | HR 66 | Ht 65.0 in | Wt 201.6 lb

## 2017-07-30 DIAGNOSIS — K529 Noninfective gastroenteritis and colitis, unspecified: Secondary | ICD-10-CM

## 2017-07-30 NOTE — Patient Instructions (Signed)
If you are age 36 or older, your body mass index should be between 23-30. Your Body mass index is 33.55 kg/m. If this is out of the aforementioned range listed, please consider follow up with your Primary Care Provider.  If you are age 47 or younger, your body mass index should be between 19-25. Your Body mass index is 33.55 kg/m. If this is out of the aformentioned range listed, please consider follow up with your Primary Care Provider.   Follow up as needed.  Thank you for choosing me and [facility].   Willette Cluster, NP

## 2017-07-30 NOTE — Progress Notes (Signed)
Chief Complaint:  diarrhea  Referring Provider:   Olive Bass,    ASSESSMENT AND PLAN;   62.  37 year old female with chronic intermittent non-bloody diarrhea (averaging once / week) with normal stools in between. No associated weight loss.  Probably IBS.  2. Nausea, vomiting, diarrhea while on business trip to [place] in March.  Person with her on trip developed same symptoms. Following that acute illness patient had intermittent bouts of severe crampy, non-bloody diarrhea when tried to advance beyond [employer]. Took imodium as needed. BMs back to baseline for two weeks, off imodium and on regular diet -probably infectious etiology which led to exacerbation of underlying IBS. At this point there is no need for stool studies or endoscopic work-up as her symptoms have resolved.  Patient will call us back ASAP for recurrent diarrhea, abdominal pain, nausea, vomiting or other GI symptoms.   3.  Cholelithiasis.  Asymptomatic at this point it seems.  Patient understands that she has gallstones and while asymptomatic now, she could have problems at some point  4. Lupus / RA, on methotrexate and plaquenil   HPI:    Joann Harrison is a 37 year old female with history of lupus and rheumatoid arthritis on several medications including methotrexate and Plaquenil. Joann Harrison has a history of chronic intermittent diarrhea for as long as she can remember.  Patient comes in today for evaluation of recent increase in diarrhea.  In March she went with a colleague to [place] for business. They ate [place] food and subsequently both developed nausea, vomiting and terrible , non-bloody diarrhea.  It took about a week on BRAT but she did seem to get better.  Rheumatologist held her methotrexate for a while. She restarted methotrexate then on 3/25, after eating for fried food she got recurrent diarrhea.  The diarrhea was severe and lasted for several hours.  She ended up calling "[facility]" and advised to  take Imodium.  She got better but on 4/4 had one bout of crampy diarrhea.  At this point patient was frustrated, made appt with PCP.  Labs were normal.  The last bout of  diarrhea occurred after eating fatty foods so patient was sent for ultrasound which showed gallstones .  Surgical evaluation offered, patient declined. For the diarrhea she was given a 5-day course of Cipro.  Patient subsequently went on another business trip from April 9-13.  She had some diarrhea during that time but took Imodium again and did okay.  She has not had any diarrhea off Imodium since 4/13.  Her stools are now solid and she is eating a regular diet.  We discussed patient's history of intermittent diarrhea.  It seems like once a week she has a bout of diarrhea then back to normal stools.  Between July and December 2018 she was on [facility] for RA and was having more diarrhea during that time.  She switched to Rituxan and following that went back to her normal bowel habits of solid stools with diarrhea about once a week.  Weight is overall stable.  There is no family history of chronic GI diseases.    Past Medical History:  Diagnosis Date  . ALLERGIC RHINITIS   . ASTHMA   . 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 DEPRESSION   . MIGRAINE HEADACHE   . Rheumatoid arthritis(714.0)    neg CCP, +RF - follows with Dr. Roberts Gaudy  in [place]     Past Surgical History:  Procedure Laterality Date  . HEMORRHOID SURGERY  1983   Family History  Problem Relation Age of Onset  . Arthritis Other        grandparent  . Hyperlipidemia Other        parent & grandparent  . Stroke Other        grandparent  . Diabetes Other        other relative   Social History   Tobacco Use  . Smoking status: Former Smoker    Types: Cigarettes    Last attempt to quit: 04/02/2002    Years since quitting: 15.3  . Smokeless tobacco: Never Used  Substance Use Topics    . Alcohol use: Yes    Comment: social  . Drug use: No   Current Outpatient Medications  Medication Sig Dispense Refill  . albuterol (PROAIR HFA) 108 (90 BASE) MCG/ACT inhaler Inhale 2 puffs into the lungs every 6 (six) hours as needed for wheezing. 8.5 each 0  . azelastine (ASTELIN) 0.1 % nasal spray Place 2 sprays into both nostrils 2 (two) times daily. Use in each nostril as directed    . CAMILA 0.35 MG tablet Take 1 tablet by mouth daily.  4  . cyclobenzaprine (FLEXERIL) 5 MG tablet TAKE 1 TAB BY MOUTH THREE TIMES DAILY    . Dietary Management Product (RHEUMATE PO) Take by mouth.    . fluticasone (FLONASE) 50 MCG/ACT nasal spray Place into both nostrils daily.    . folic acid (FOLVITE) 1 MG tablet Take 1 mg by mouth daily.    . hydroxychloroquine (PLAQUENIL) 200 MG tablet Take by mouth 2 (two) times daily.      Marland Kitchen levocetirizine (XYZAL) 5 MG tablet Take 5 mg by mouth every evening.    Marland Kitchen LORazepam (ATIVAN) 1 MG tablet     . meloxicam (MOBIC) 15 MG tablet Take 15 mg by mouth daily.      . methotrexate (RHEUMATREX) 7.5 MG tablet Take 7.5 mg by mouth once a week. Caution" Chemotherapy. Protect from light.    . montelukast (SINGULAIR) 10 MG tablet Take 10 mg by mouth at bedtime.    Marland Kitchen PROAIR RESPICLICK 108 (90 Base) MCG/ACT AEPB     . rizatriptan (MAXALT) 10 MG tablet Take 1 tablet (10 mg total) by mouth as needed for migraine. May repeat in 2 hours if needed 10 tablet 0  . sertraline (ZOLOFT) 100 MG tablet Take 100 mg by mouth daily.      . Vitamin D, Ergocalciferol, (DRISDOL) 50000 units CAPS capsule      No current facility-administered medications for this visit.    Allergies  Allergen Reactions  . Aspirin Other (See Comments)    Nose bleeds  . Clindamycin/Lincomycin     Diarrhea  . Penicillins Swelling  . Sulfonamide Derivatives Swelling     Review of Systems: Fatigue, menstrual pain, sore throat. All other systems reviewed and negative except where noted in HPI.   Physical  Exam:    Wt Readings from Last 3 Encounters:  07/30/17 201 lb 9.6 oz (91.4 kg)  07/05/17 205 lb (93 kg)  03/21/17 209 lb (94.8 kg)    BP 114/78   Pulse 66   Ht 5\' 5"  (1.651 m)   Wt 201 lb 9.6 oz (91.4 kg)   BMI 33.55 kg/m  Constitutional:  Pleasant white female in no acute distress. Psychiatric: Normal mood and affect. Behavior is normal. EENT: Pupils normal.  Conjunctivae are normal. No scleral icterus. Neck supple.  Cardiovascular: Normal rate, regular rhythm. No edema Pulmonary/chest: Effort normal and breath sounds normal. No wheezing, rales or rhonchi. Abdominal: Soft, nondistended. Nontender. Bowel sounds active throughout. There are no masses palpable. No hepatomegaly. Neurological: Alert and oriented to person place and time. Skin: Skin is warm and dry. No rashes noted.  Willette Cluster, NP  07/30/2017, 10:05 AM  Cc:  Olive Bass,*

## 2017-08-01 ENCOUNTER — Encounter: Payer: Self-pay | Admitting: Nurse Practitioner

## 2017-08-02 NOTE — Progress Notes (Signed)
Reviewed and agree with documentation and assessment and plan. K. Veena Monterrius Cardosa , MD   

## 2019-01-13 DIAGNOSIS — M069 Rheumatoid arthritis, unspecified: Secondary | ICD-10-CM | POA: Insufficient documentation

## 2019-01-13 DIAGNOSIS — J45909 Unspecified asthma, uncomplicated: Secondary | ICD-10-CM | POA: Insufficient documentation

## 2019-01-14 DIAGNOSIS — F419 Anxiety disorder, unspecified: Secondary | ICD-10-CM | POA: Insufficient documentation

## 2019-03-31 ENCOUNTER — Telehealth: Payer: BC Managed Care – PPO | Admitting: Physician Assistant

## 2019-03-31 DIAGNOSIS — J349 Unspecified disorder of nose and nasal sinuses: Secondary | ICD-10-CM | POA: Diagnosis not present

## 2019-03-31 DIAGNOSIS — R0981 Nasal congestion: Secondary | ICD-10-CM | POA: Diagnosis not present

## 2019-03-31 MED ORDER — GUAIFENESIN ER 600 MG PO TB12: 600.0000 mg | ORAL_TABLET | Freq: Two times a day (BID) | ORAL | 0 refills | Status: DC | PRN

## 2019-03-31 MED ORDER — AFRIN NASAL SPRAY 0.05 % NA SOLN: 1.0000 | Freq: Two times a day (BID) | NASAL | 0 refills | Status: AC

## 2019-03-31 NOTE — Progress Notes (Signed)
We are sorry that you are not feeling well.  Here is how we plan to help!  Based on what you have shared with me it looks like you have sinusitis.  Sinusitis is inflammation and infection in the sinus cavities of the head.  Based on your presentation I believe you most likely have Acute Viral Sinusitis.This is an infection most likely caused by a virus. There is not specific treatment for viral sinusitis other than to help you with the symptoms until the infection runs its course.  You may use an oral decongestant such as Mucinex D or if you have glaucoma or high blood pressure use plain Mucinex. Saline nasal spray help and can safely be used as often as needed for congestion, I have prescribed: Afrin nasal spray, use twice daily for nasal congestion for NO LONGER THAN 3 days. If used longer, can cause rebound congestion. Use it after you use your allergy nasal spray.   The vast majority of sinusitis is viral, though we consider treating with antibiotics for those that have had symptoms for 10 days or longer with no improvement in symptoms, and/or have a fever with their symptoms. If you meed this criteria, I would contact your PCP for further recommendations and consider an antibiotic for treatment.   With regards to COVID testing, I am happy to provide you with information regarding testing. Since you are already quarantining at home and we are not treating with antibiotics at this time, it does not necessarily change our management. I would not say you absolutely have to be tested for COVID unless you are not quarantining or if your symptoms change or worsen.    Some authorities believe that zinc sprays or the use of Echinacea may shorten the course of your symptoms.  Sinus infections are not as easily transmitted as other respiratory infection, however we still recommend that you avoid close contact with loved ones, especially the very young and elderly.  Remember to wash your hands thoroughly  throughout the day as this is the number one way to prevent the spread of infection!  Home Care:  Only take medications as instructed by your medical team.  Do not take these medications with alcohol.  A steam or ultrasonic humidifier can help congestion.  You can place a towel over your head and breathe in the steam from hot water coming from a faucet.  Avoid close contacts especially the very young and the elderly.  Cover your mouth when you cough or sneeze.  Always remember to wash your hands.  Get Help Right Away If:  You develop worsening fever or sinus pain.  You develop a severe head ache or visual changes.  Your symptoms persist after you have completed your treatment plan.  Make sure you  Understand these instructions.  Will watch your condition.  Will get help right away if you are not doing well or get worse.  Your e-visit answers were reviewed by a board certified advanced clinical practitioner to complete your personal care plan.  Depending on the condition, your plan could have included both over the counter or prescription medications.  If there is a problem please reply  once you have received a response from your provider.  Your safety is important to [place].  If you have drug allergies check your prescription carefully.    You can use MyChart to ask questions about today's visit, request a non-urgent call back, or ask for a work or school excuse for 24 hours  related to this e-Visit. If it has been greater than 24 hours you will need to follow up with your provider, or enter a new e-Visit to address those concerns.  You will get an e-mail in the next two days asking about your experience.  I hope that your e-visit has been valuable and will speed your recovery. Thank you for using e-visits.  Greater than 5 minutes, yet less than 10 minutes of time have been spent researching, coordinating, and implementing care for this patient today.

## 2019-04-14 ENCOUNTER — Telehealth: Payer: BC Managed Care – PPO | Admitting: Physician Assistant

## 2019-04-14 DIAGNOSIS — Z20822 Contact with and (suspected) exposure to covid-19: Secondary | ICD-10-CM

## 2019-04-14 MED ORDER — ONDANSETRON 4 MG PO TBDP: 4.0000 mg | ORAL_TABLET | Freq: Three times a day (TID) | ORAL | 0 refills | Status: DC | PRN

## 2019-04-14 NOTE — Progress Notes (Signed)
E-Visit for Corona Virus Screening   Your current symptoms could be consistent with the coronavirus.  Many health care providers can now test patients at their office but not all are.  [facility] has multiple testing sites. For information on our COVID testing locations and hours go to [email or web address]  We are enrolling you in our MyChart Home Monitoring for COVID19 . Daily you will receive a questionnaire within the MyChart website. Our COVID 19 response team will be monitoring your responses daily.  Testing Information: The COVID-19 Community Testing sites will begin testing BY APPOINTMENT ONLY.  You can schedule online at [email or web address]  If you do not have access to a smart phone or computer you may call [phone] for an appointment.  Testing Locations: Appointment schedule is 8 am to 3:30 pm at all sites  [facility] indoors at [address], [place] [address] [facility]  indoors at [facility] [address], [place], [place] [address] [place] indoors at [address], [place] [address]  Additional testing sites in the Community:  . For [facility] Testing sites in [facility]  FarmerBuys.com.au  . For Pop-up testing sites in [place]  [email or web address]  . For Testing sites with regular hours [email or web address]  . For [facility] [email or web address]  . For [facility] [email or web address].dk  . For [facility] testing in [place] and [employer] [email or web address].dk  . For [facility] testing in [facility]   [email or web address]  For  more  information about community testing call [phone]   We are enrolling you in our MyChart Home Monitoring for COVID19 . Daily you will receive a questionnaire within the MyChart website. Our COVID 19 response team will be monitoring your responses daily.  Please quarantine yourself while awaiting your test results. If you develop fever/cough/breathlessness, please stay home for 10 days with improving symptoms and until you have had 24 hours of no fever (without taking a fever reducer).  You should wear a mask or cloth face covering over your nose and mouth if you must be around other people or animals, including pets (even at home). Try to stay at least 6 feet away from other people. This will protect the people around you.  Please continue good preventive care measures, including:  frequent hand-washing, avoid touching your face, cover coughs/sneezes, stay out of crowds and keep a 6 foot distance from others.  COVID-19 is a respiratory illness with symptoms that are similar to the flu. Symptoms are typically mild to moderate, but there have been cases of severe illness and death due to the virus.   The following symptoms may appear 2-14 days after exposure: . Fever . Cough . Shortness of breath or difficulty breathing . Chills . Repeated shaking with chills . Muscle pain . Headache . Sore throat . New loss of taste or smell . Fatigue . Congestion or runny nose . Nausea or vomiting . Diarrhea  Go to the nearest hospital ED for assessment if fever/cough/breathlessness are severe or illness seems like a threat to life.  It is vitally important that if you feel that you have an infection such as this virus or any other virus that you stay home and away from places where you may spread it to others.  You should avoid contact with people age 48 and older.   You can use medication such as: A prescription nausea medicine called zofran  You may also take acetaminophen (Tylenol) as needed for  fever.  Reduce your risk of any infection by using the same precautions used for avoiding the common cold or flu:  Marland Kitchen Wash your hands often with soap and warm water for at least 20 seconds.  If soap and water are not readily available, use an alcohol-based hand sanitizer with at least 60% alcohol.  . If coughing or sneezing, cover your mouth and nose by coughing or sneezing into the elbow areas of your shirt or coat, into a tissue or into your sleeve (not your hands). . Avoid shaking hands with others and consider head nods or verbal greetings only. . Avoid touching your eyes, nose, or mouth with unwashed hands.  . Avoid close contact with people who are sick. . Avoid places or events with large numbers of people in one location, like concerts or sporting events. . Carefully consider travel plans you have or are making. . If you are planning any travel outside or inside the [place], visit the CDC's Travelers' Health webpage for the latest health notices. . If you have some symptoms but not all symptoms, continue to monitor at home and seek medical attention if your symptoms worsen. . If you are having a medical emergency, call 911.  HOME CARE . Only take medications as instructed by your medical team. . Drink plenty of fluids and get plenty of rest. . A steam or ultrasonic humidifier can help if you have congestion.   GET HELP RIGHT AWAY IF YOU HAVE EMERGENCY WARNING SIGNS** FOR COVID-19. If you or someone is showing any of these signs seek emergency medical care immediately. Call 911 or proceed to your closest emergency facility if: . You develop worsening high fever. . Trouble breathing . Bluish lips or face . Persistent pain or pressure in the chest . New confusion . Inability to wake or stay awake . You cough up blood. . Your symptoms become more severe  **This list is not all possible symptoms. Contact your medical provider for any symptoms that are sever or concerning to you.  MAKE  SURE YOU   Understand these instructions.  Will watch your condition.  Will get help right away if you are not doing well or get worse.  Your e-visit answers were reviewed by a board certified advanced clinical practitioner to complete your personal care plan.  Depending on the condition, your plan could have included both over the counter or prescription medications.  If there is a problem please reply once you have received a response from your provider.  Your safety is important to [place].  If you have drug allergies check your prescription carefully.    You can use MyChart to ask questions about today's visit, request a non-urgent call back, or ask for a work or school excuse for 24 hours related to this e-Visit. If it has been greater than 24 hours you will need to follow up with your provider, or enter a new e-Visit to address those concerns. You will get an e-mail in the next two days asking about your experience.  I hope that your e-visit has been valuable and will speed your recovery. Thank you for using e-visits.   6 minutes spent on this chart

## 2019-04-19 IMAGING — US US ABDOMEN COMPLETE
1 series · 14 of 25 positions shown · non-contrast
Comparison: 11/27/2013 CT

CLINICAL DATA: Abdominal pain

EXAM:
ABDOMEN ULTRASOUND COMPLETE

[Series 1: us abdomen complete · 0.20mm/px · 14 of 79 slices shown]
[im 1/79]
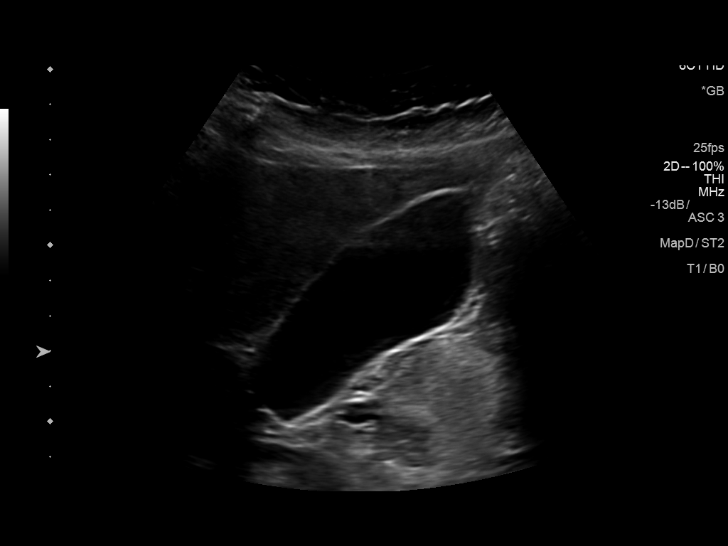
[im 7/79]
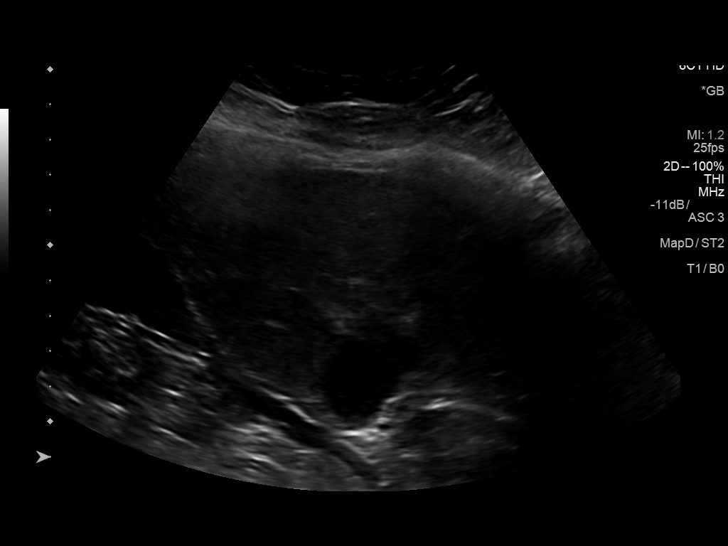
[im 14/79]
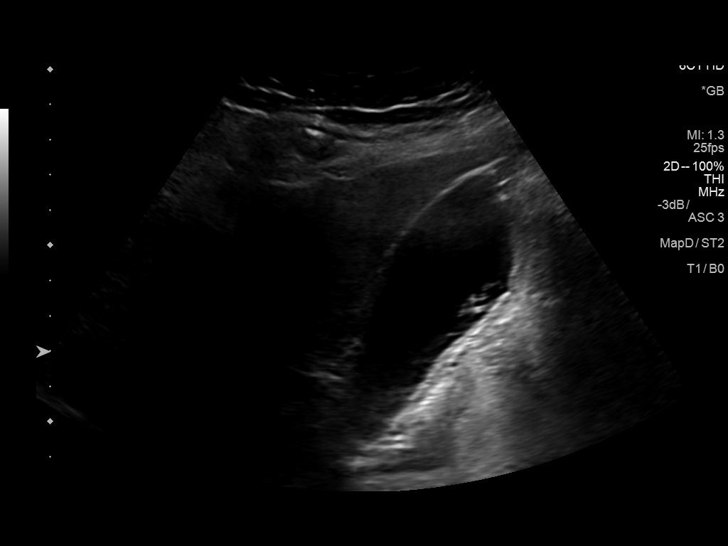
[im 20/79]
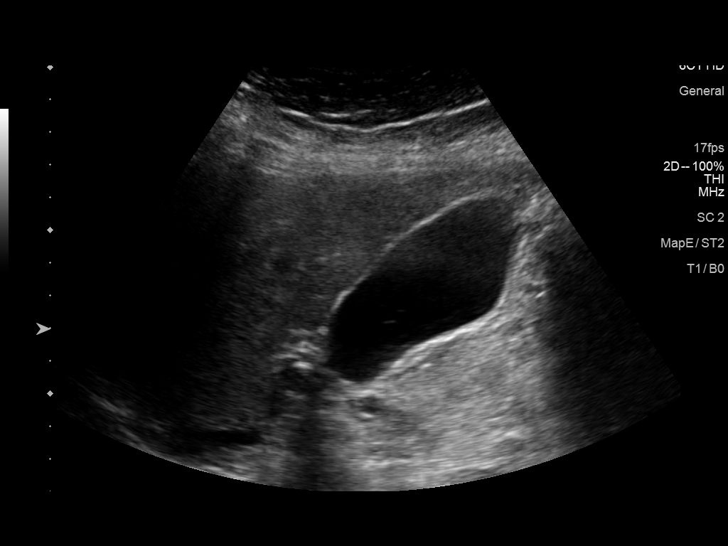
[im 27/79]
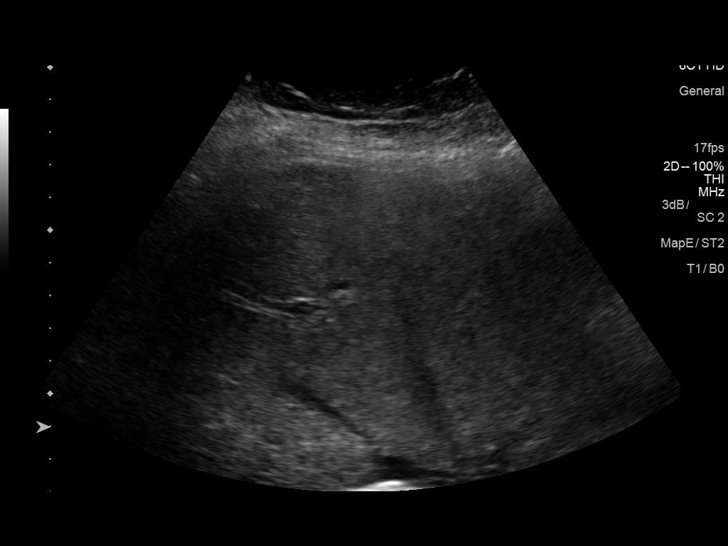
[im 30/79]
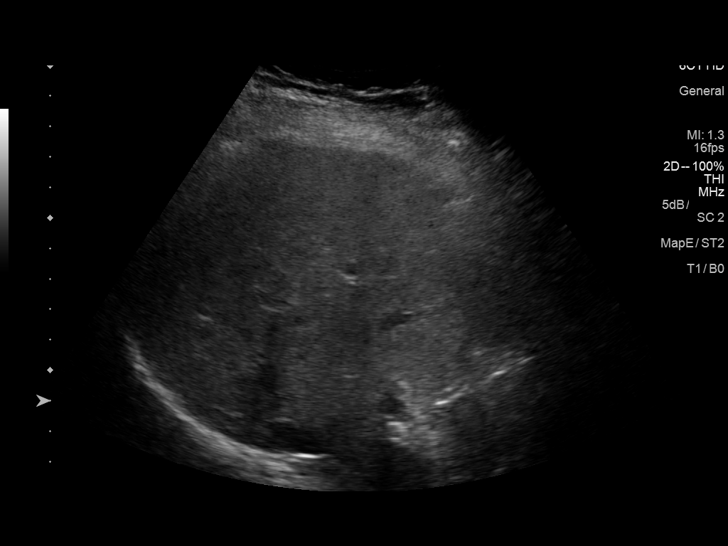
[im 36/79]
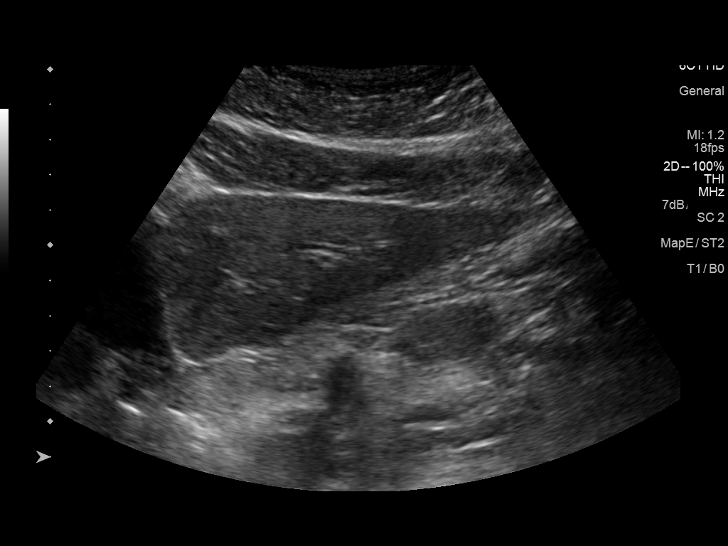
[im 43/79]
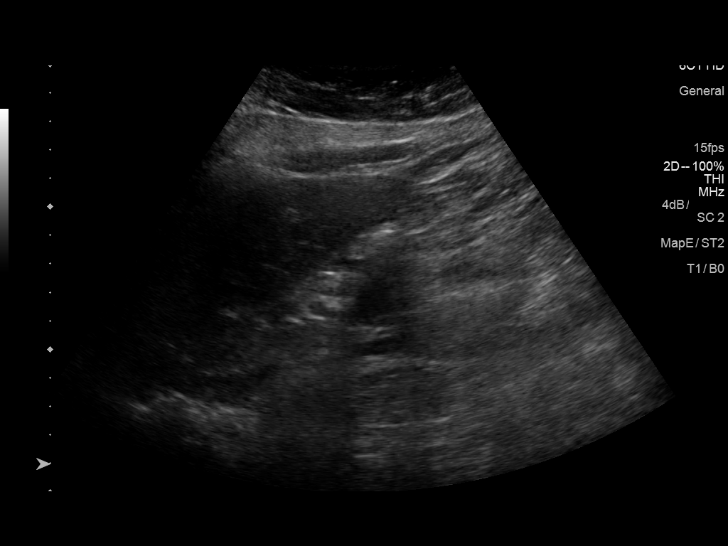
[im 49/79]
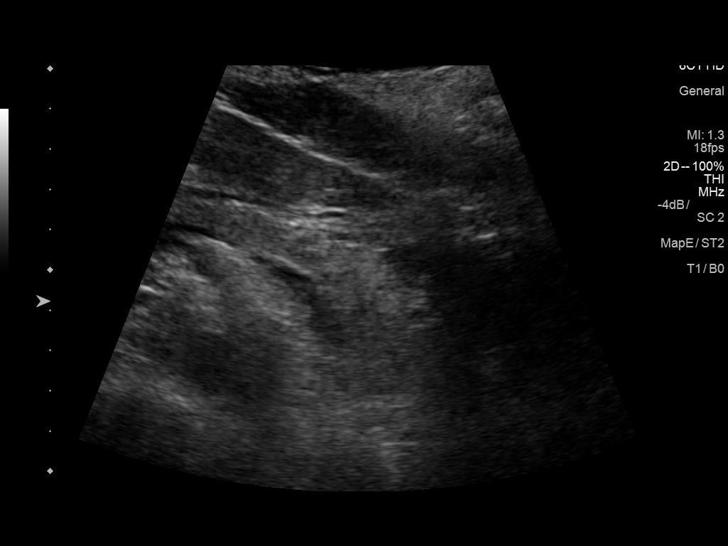
[im 53/79]
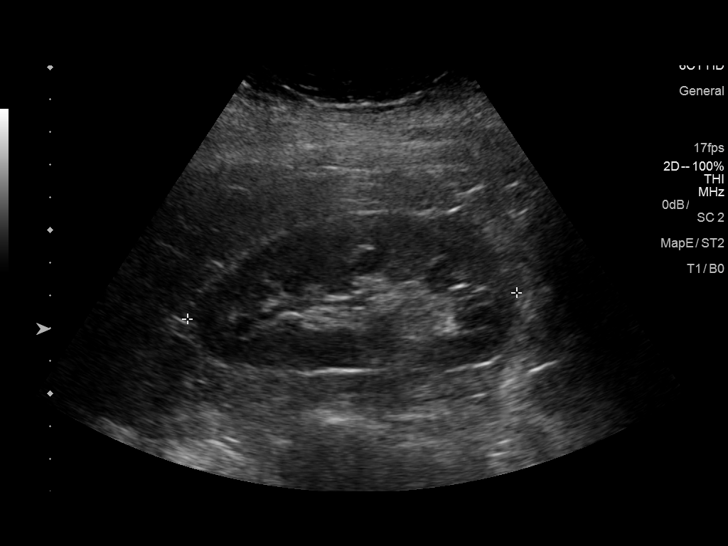
[im 59/79]
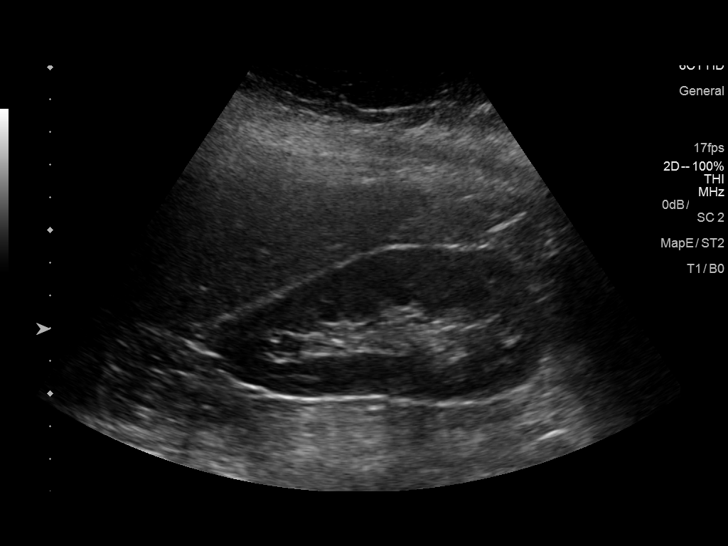
[im 66/79]
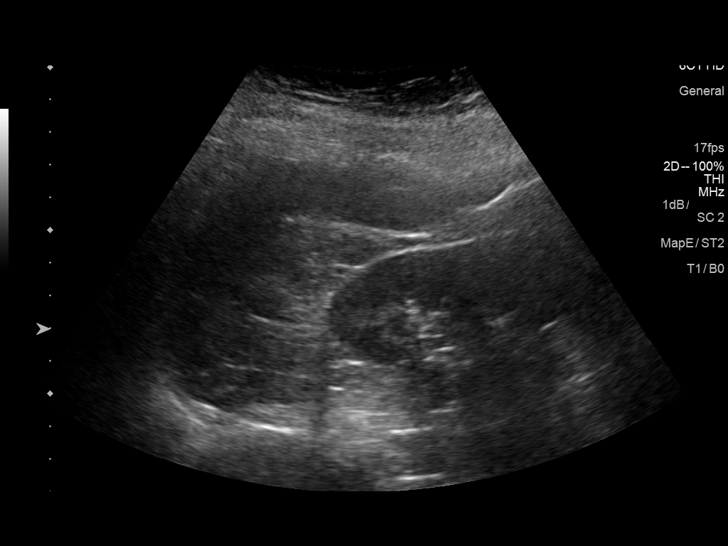
[im 72/79]
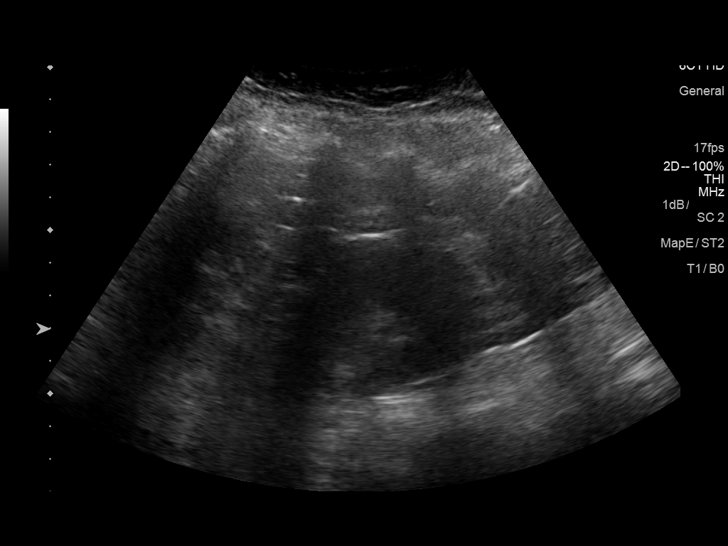
[im 79/79]
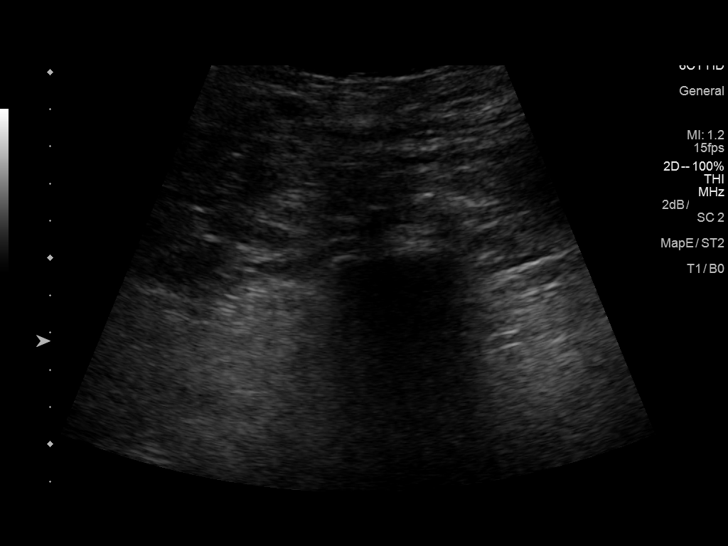

[14 of 25 positions shown; findings below may reference images not displayed]

FINDINGS: Gallbladder: Small nonshadowing mobile gallstones, 5 mm or less in
size. No wall thickening or sonographic Murphy sign.

Common bile duct: Diameter: Normal caliber, 3 mm

Liver: No focal lesion identified. Within normal limits in
parenchymal echogenicity. Portal vein is patent on color Doppler
imaging with normal direction of blood flow towards the liver.

IVC: No abnormality visualized.

Pancreas: Visualized portion unremarkable.

Spleen: Size and appearance within normal limits.

Right Kidney: Length: 10.2 cm. Echogenicity within normal limits. No
mass or hydronephrosis visualized.

Left Kidney: Length: 11.5 cm. Echogenicity within normal limits. No
mass or hydronephrosis visualized.

Abdominal aorta: No aneurysm visualized.

Other findings: None.
IMPRESSION: Small mobile gallstones.  No evidence of acute cholecystitis.

No acute findings.

## 2019-09-02 ENCOUNTER — Ambulatory Visit: Payer: BC Managed Care – PPO | Admitting: Nurse Practitioner

## 2019-09-02 ENCOUNTER — Encounter: Payer: Self-pay | Admitting: Nurse Practitioner

## 2019-09-02 ENCOUNTER — Telehealth: Payer: Self-pay | Admitting: Nurse Practitioner

## 2019-09-02 VITALS — BP 122/72 | HR 79 | Ht 65.0 in | Wt 206.1 lb

## 2019-09-02 DIAGNOSIS — R194 Change in bowel habit: Secondary | ICD-10-CM | POA: Diagnosis not present

## 2019-09-02 DIAGNOSIS — R1012 Left upper quadrant pain: Secondary | ICD-10-CM

## 2019-09-02 DIAGNOSIS — K625 Hemorrhage of anus and rectum: Secondary | ICD-10-CM

## 2019-09-02 MED ORDER — OMEPRAZOLE 40 MG PO CPDR
40.0000 mg | DELAYED_RELEASE_CAPSULE | Freq: Every day | ORAL | 5 refills | Status: DC
Start: 2019-09-02 — End: 2019-09-04

## 2019-09-02 MED ORDER — NA SULFATE-K SULFATE-MG SULF 17.5-3.13-1.6 GM/177ML PO SOLN: 1.0000 | Freq: Once | ORAL | 0 refills | Status: AC

## 2019-09-02 NOTE — Telephone Encounter (Signed)
Please advise 

## 2019-09-02 NOTE — Patient Instructions (Signed)
If you are age 38 or older, your body mass index should be between 23-30. Your Body mass index is 34.3 kg/m. If this is out of the aforementioned range listed, please consider follow up with your Primary Care Provider.  If you are age 80 or younger, your body mass index should be between 19-25. Your Body mass index is 34.3 kg/m. If this is out of the aformentioned range listed, please consider follow up with your Primary Care Provider.   We have sent the following medications to your pharmacy for you to pick up at your convenience: Omeprazole 40 mg daily 30-60 minutes before breakfast.   Stop Pepcid.   Start Citrucel 2 tablespoons in 8 ounces of water daily.  You have been scheduled for an endoscopy and colonoscopy. Please follow the written instructions given to you at your visit today. Please pick up your prep supplies at the pharmacy within the next 1-3 days. If you use inhalers (even only as needed), please bring them with you on the day of your procedure.

## 2019-09-02 NOTE — Progress Notes (Signed)
IMPRESSION and PLAN:    39 yo female with pmh significant for cholelithiasis, lupus/RA on methotrexate, Plaquenil and Rituxan  # Bowel changes ( consistency / frequency) and also occasional blood in stool --Changes in consistency ( soft -loose alternating with hard stools). more frequent BMs and also with recent onset of minor bleeding when constipated.  --Bowel changes could be functional with bleeding being anorectal in nature.  However, other etiologies such as IBD, polyps, neoplasm should be excluded.  --Patient will be scheduled for a colonoscopy. The risks and benefits of colonoscopy with possible polypectomy / biopsies were discussed and the patient agrees to proceed.  --Start Citrucel, 2 tablespoons daily in 8 ounces of water  # LUQ pain --4 months duration --Pain worse after NSAID which she requires TIW for RA. Rule out PUD --I understand it is difficult for her to discontinue NSAIDs.  --For now will discontinue famotidine which she takes 3 times a week the night before dose of Mobic.  Start omeprazole 40 mg every morning.   --For further evaluation of pain we will schedule patient for EGD. The risks and benefits of EGD were discussed and the patient agrees to proceed.  --Hopefully no ulcers will be found and her need for NSAIDs will be less concerning from GI standpoint  # Lupus / RA --On Plaquenil, methotrexate and Rituxan       HPI:    Primary GI: Harl Bowie, MD   Chief complaint : bowel changes, abdominal pain  39 year old female who I saw April 2019 with nausea, vomiting, bloody diarrhea.  Patient believes she had suffered from a bout of food poisoning. Her symptoms resolved, BMs returned to baseline .   HISTORY SINCE LAST VISIT: Approximately 1 year ago Nichoel noticed her bowels were changing.  Her stools were softer, more frequent and this was alternating with hard stools at times.  She has recently begun seeing mucus in her stool at times.  And  even more recently has noticed some bright red blood associated with the harder stools.  Her weight has been stable.  She cannot attribute bowel changes to any of her medications or dietary changes.  In addition to above, Mckynlie gives a 41-monthhistory of LUQ pain/soreness. She requires NSAIDs ( mobic) 3 times a week for her rheumatologic problems.  She started taking Pepcid the night before each dose of NSAIDs.  She has noticed that the pain is worse after taking the NSAIDs.  No associated nausea or vomiting.  She cannot correlate the pain with eating.   Previous Endoscopic Evaluations: None  Data Review:  No labs in epic since 2019.  Patient says rheumatology follow his labs closely including liver tests on methotrexate  Review of systems:     No chest pain, no SOB, no fevers, no urinary sx   Past Medical History:  Diagnosis Date  . ALLERGIC RHINITIS   . ASTHMA   . 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 DEPRESSION   . MIGRAINE HEADACHE   . Rheumatoid arthritis(714.0)    neg CCP, +RF - follows with Dr. KTheodis Aguasin CHankins   Patient's surgical history, family medical history, social history, medications and allergies were all reviewed in Epic   Creatinine clearance cannot be calculated (Patient's most recent lab result is older than the maximum 21 days allowed.)  Current Outpatient Medications  Medication  Sig Dispense Refill  . albuterol (PROAIR HFA) 108 (90 BASE) MCG/ACT inhaler Inhale 2 puffs into the lungs every 6 (six) hours as needed for wheezing. 8.5 each 0  . azelastine (ASTELIN) 0.1 % nasal spray Place 2 sprays into both nostrils 2 (two) times daily. Use in each nostril as directed    . cyclobenzaprine (FLEXERIL) 5 MG tablet TAKE 1 TAB BY MOUTH THREE TIMES DAILY    . Dietary Management Product (RHEUMATE PO) Take by mouth.    . etonogestrel-ethinyl estradiol (NUVARING) 0.12-0.015 MG/24HR vaginal ring  Place 1 each vaginally every 28 (twenty-eight) days. Insert vaginally and leave in place for 3 consecutive weeks, then remove for 1 week.    . fluticasone (FLONASE) 50 MCG/ACT nasal spray Place into both nostrils daily.    . folic acid (FOLVITE) 1 MG tablet Take 1 mg by mouth daily.    Marland Kitchen guaiFENesin (MUCINEX) 600 MG 12 hr tablet Take 1 tablet (600 mg total) by mouth 2 (two) times daily as needed for to loosen phlegm. 30 tablet 0  . hydroxychloroquine (PLAQUENIL) 200 MG tablet Take by mouth 2 (two) times daily.      Marland Kitchen levocetirizine (XYZAL) 5 MG tablet Take 5 mg by mouth every evening.    . meloxicam (MOBIC) 15 MG tablet Take 15 mg by mouth daily.      . methotrexate (RHEUMATREX) 7.5 MG tablet Take 7.5 mg by mouth once a week. Caution" Chemotherapy. Protect from light.    . montelukast (SINGULAIR) 10 MG tablet Take 10 mg by mouth at bedtime.    Marland Kitchen PROAIR RESPICLICK 578 (90 Base) MCG/ACT AEPB     . Vitamin D, Ergocalciferol, (DRISDOL) 50000 units CAPS capsule     . Na Sulfate-K Sulfate-Mg Sulf 17.5-3.13-1.6 GM/177ML SOLN Take 1 kit by mouth once for 1 dose. Apply Coupon=BIN: [id number] PCN: CN GROUP: [id number] ID: [id number] 354 mL 0  . omeprazole (PRILOSEC) 40 MG capsule Take 1 capsule (40 mg total) by mouth daily. 30 capsule 5   No current facility-administered medications for this visit.    Physical Exam:     BP 122/72   Pulse 79   Ht _0  (1.651 m)   Wt 206 lb 2 oz (93.5 kg)   BMI 34.30 kg/m   GENERAL:  Pleasant female in NAD PSYCH: : Cooperative, normal affect CARDIAC:  RRR PULM: Normal respiratory effort, lungs CTA bilaterally, no wheezing ABDOMEN:  Nondistended, soft, nontender. No obvious masses, no hepatomegaly,  normal bowel sounds SKIN:  turgor, no lesions seen Musculoskeletal:  Normal muscle tone, normal strength NEURO: Alert and oriented x 3, no focal neurologic deficits   Tye Savoy , NP 09/02/2019, 11:00 AM

## 2019-09-02 NOTE — Telephone Encounter (Signed)
[facility] called stating patient take methotrexsate as well they need to confirm for Omeprazole

## 2019-09-03 NOTE — Telephone Encounter (Signed)
Joann Harrison,  PPI may affect metabolism of Methotrexate it looks like. Please ask patient to change to Pepid 20 mg qam. Thanks

## 2019-09-04 ENCOUNTER — Other Ambulatory Visit: Payer: Self-pay

## 2019-09-04 MED ORDER — FAMOTIDINE 20 MG PO TABS
20.0000 mg | ORAL_TABLET | Freq: Every morning | ORAL | 2 refills | Status: DC
Start: 2019-09-04 — End: 2019-12-08

## 2019-09-04 NOTE — Progress Notes (Signed)
Reviewed and agree with documentation and assessment and plan. K. Veena Matther Labell , MD   

## 2019-09-04 NOTE — Progress Notes (Signed)
Omeprazole has been changed to Pepcid 20 mg every am as directed by Lafe Garin, NP

## 2019-09-04 NOTE — Telephone Encounter (Signed)
Left a detailed voicemail for the patient informing her of the new Rx and to D/C omeprazole

## 2019-10-09 ENCOUNTER — Encounter: Payer: Self-pay | Admitting: Gastroenterology

## 2019-10-16 ENCOUNTER — Ambulatory Visit (AMBULATORY_SURGERY_CENTER): Payer: BC Managed Care – PPO | Admitting: Gastroenterology

## 2019-10-16 ENCOUNTER — Other Ambulatory Visit: Payer: Self-pay

## 2019-10-16 ENCOUNTER — Encounter: Payer: Self-pay | Admitting: Gastroenterology

## 2019-10-16 VITALS — BP 137/88 | HR 89 | Temp 97.3°F | Resp 17 | Ht 65.0 in | Wt 206.0 lb

## 2019-10-16 DIAGNOSIS — I890 Lymphedema, not elsewhere classified: Secondary | ICD-10-CM

## 2019-10-16 DIAGNOSIS — K2940 Chronic atrophic gastritis without bleeding: Secondary | ICD-10-CM

## 2019-10-16 DIAGNOSIS — K625 Hemorrhage of anus and rectum: Secondary | ICD-10-CM

## 2019-10-16 DIAGNOSIS — K649 Unspecified hemorrhoids: Secondary | ICD-10-CM | POA: Diagnosis not present

## 2019-10-16 DIAGNOSIS — R1012 Left upper quadrant pain: Secondary | ICD-10-CM

## 2019-10-16 MED ORDER — SODIUM CHLORIDE 0.9 % IV SOLN: 500.0000 mL | Freq: Once | INTRAVENOUS | Status: DC

## 2019-10-16 NOTE — Patient Instructions (Signed)
Impression/Recommendations:  Hemorrhoid handout given to patient.  Resume previous diet. Continue present medications. Await pathology results.  Use Benefiber 2 teaspoons by mouth 2 times daily.  Preparation H suppository.  Insert rectally as necessary at bedtime 5-7 days.  Repeat colonoscopy in 10 years for screening purposes.  YOU HAD AN ENDOSCOPIC PROCEDURE TODAY AT THE [facility]:   Refer to the procedure report that was given to you for any specific questions about what was found during the examination.  If the procedure report does not answer your questions, please call your gastroenterologist to clarify.  If you requested that your care partner not be given the details of your procedure findings, then the procedure report has been included in a sealed envelope for you to review at your convenience later.  YOU SHOULD EXPECT: Some feelings of bloating in the abdomen. Passage of more gas than usual.  Walking can help get rid of the air that was put into your GI tract during the procedure and reduce the bloating. If you had a lower endoscopy (such as a colonoscopy or flexible sigmoidoscopy) you may notice spotting of blood in your stool or on the toilet paper. If you underwent a bowel prep for your procedure, you may not have a normal bowel movement for a few days.  Please Note:  You might notice some irritation and congestion in your nose or some drainage.  This is from the oxygen used during your procedure.  There is no need for concern and it should clear up in a day or so.  SYMPTOMS TO REPORT IMMEDIATELY:   Following lower endoscopy (colonoscopy or flexible sigmoidoscopy):  Excessive amounts of blood in the stool  Significant tenderness or worsening of abdominal pains  Swelling of the abdomen that is new, acute  Fever of 100F or higher   Following upper endoscopy (EGD)  Vomiting of blood or coffee ground material  New chest pain or pain under the shoulder  blades  Painful or persistently difficult swallowing  New shortness of breath  Fever of 100F or higher  Black, tarry-looking stools  For urgent or emergent issues, a gastroenterologist can be reached at any hour by calling [phone]. Do not use MyChart messaging for urgent concerns.    DIET:  We do recommend a small meal at first, but then you may proceed to your regular diet.  Drink plenty of fluids but you should avoid alcoholic beverages for 24 hours.  ACTIVITY:  You should plan to take it easy for the rest of today and you should NOT DRIVE or use heavy machinery until tomorrow (because of the sedation medicines used during the test).    FOLLOW UP: Our staff will call the number listed on your records 48-72 hours following your procedure to check on you and address any questions or concerns that you may have regarding the information given to you following your procedure. If we do not reach you, we will leave a message.  We will attempt to reach you two times.  During this call, we will ask if you have developed any symptoms of COVID 19. If you develop any symptoms (ie: fever, flu-like symptoms, shortness of breath, cough etc.) before then, please call [phone].  If you test positive for Covid 19 in the 2 weeks post procedure, please call and report this information to [place].    If any biopsies were taken you will be contacted by phone or by letter within the next 1-3 weeks.  Please  call us at [phone] if you have not heard about the biopsies in 3 weeks.    SIGNATURES/CONFIDENTIALITY: You and/or your care partner have signed paperwork which will be entered into your electronic medical record.  These signatures attest to the fact that that the information above on your After Visit Summary has been reviewed and is understood.  Full responsibility of the confidentiality of this discharge information lies with you and/or your care-partner.

## 2019-10-16 NOTE — Progress Notes (Signed)
PT taken to PACU. Monitors in place. VSS. Report given to RN. 

## 2019-10-16 NOTE — Progress Notes (Signed)
Called to room to assist during endoscopic procedure.  Patient ID and intended procedure confirmed with present staff. Received instructions for my participation in the procedure from the performing physician.  

## 2019-10-16 NOTE — Op Note (Signed)
[facility] Patient Name: Joann Harrison Procedure Date: 10/16/2019 3:14 PM MRN: [id number] Endoscopist: Napoleon Form , MD Age: 39 Referring MD:  Date of Birth: Apr 29, 1980 Gender: Female Account #: [id number] Procedure:                Colonoscopy Indications:              Evaluation of unexplained GI bleeding presenting                            with Hematochezia Medicines:                Monitored Anesthesia Care Procedure:                Pre-Anesthesia Assessment:                           - Prior to the procedure, a History and Physical                            was performed, and patient medications and                            allergies were reviewed. The patient's tolerance of                            previous anesthesia was also reviewed. The risks                            and benefits of the procedure and the sedation                            options and risks were discussed with the patient.                            All questions were answered, and informed consent                            was obtained. Prior Anticoagulants: The patient has                            taken no previous anticoagulant or antiplatelet                            agents. ASA Grade Assessment: II - A patient with                            mild systemic disease. After reviewing the risks                            and benefits, the patient was deemed in                            satisfactory condition to undergo the procedure.  After obtaining informed consent, the colonoscope                            was passed under direct vision. Throughout the                            procedure, the patient's blood pressure, pulse, and                            oxygen saturations were monitored continuously. The                            Colonoscope was introduced through the anus and                            advanced to the the cecum, identified by                             appendiceal orifice and ileocecal valve. The                            colonoscopy was performed without difficulty. The                            patient tolerated the procedure well. The quality                            of the bowel preparation was excellent. The                            ileocecal valve, appendiceal orifice, and rectum                            were photographed. Scope In: 3:33:15 PM Scope Out: 3:49:41 PM Scope Withdrawal Time: 0 hours 11 minutes 40 seconds  Total Procedure Duration: 0 hours 16 minutes 26 seconds  Findings:                 The perianal and digital rectal examinations were                            normal.                           Non-bleeding internal hemorrhoids were found during                            retroflexion. The hemorrhoids were small.                           The exam was otherwise without abnormality. Complications:            No immediate complications. Estimated Blood Loss:     Estimated blood loss: none. Impression:               - Non-bleeding internal hemorrhoids.                           -  The examination was otherwise normal.                           - No specimens collected. Recommendation:           - Patient has a contact number available for                            emergencies. The signs and symptoms of potential                            delayed complications were discussed with the                            patient. Return to normal activities tomorrow.                            Written discharge instructions were provided to the                            patient.                           - Resume previous diet.                           - Continue present medications.                           - Repeat colonoscopy in 10 years for screening                            purposes.                           - Use Benefiber two teaspoons PO BID.                           - Preparation  H suppository: Insert rectally as                            necessary at bedtime 5-7 days. Napoleon Form, MD 10/16/2019 4:08:09 PM This report has been signed electronically.

## 2019-10-16 NOTE — Op Note (Signed)
[facility] Patient Name: Joann Harrison Procedure Date: 10/16/2019 3:14 PM MRN: [id number] Endoscopist: Napoleon Form , MD Age: 39 Referring MD:  Date of Birth: [birth year] Gender: Female Account #: [id number] Procedure:                Upper GI endoscopy Indications:              Epigastric abdominal pain, Abdominal pain in the                            left upper quadrant Medicines:                Monitored Anesthesia Care Procedure:                Pre-Anesthesia Assessment:                           - Prior to the procedure, a History and Physical                            was performed, and patient medications and                            allergies were reviewed. The patient's tolerance of                            previous anesthesia was also reviewed. The risks                            and benefits of the procedure and the sedation                            options and risks were discussed with the patient.                            All questions were answered, and informed consent                            was obtained. Prior Anticoagulants: The patient has                            taken no previous anticoagulant or antiplatelet                            agents. ASA Grade Assessment: II - A patient with                            mild systemic disease. After reviewing the risks                            and benefits, the patient was deemed in                            satisfactory condition to undergo the procedure.  After obtaining informed consent, the endoscope was                            passed under direct vision. Throughout the                            procedure, the patient's blood pressure, pulse, and                            oxygen saturations were monitored continuously. The                            Endoscope was introduced through the mouth, and                            advanced to the second part of  duodenum. The upper                            GI endoscopy was accomplished without difficulty.                            The patient tolerated the procedure well. Scope In: Scope Out: Findings:                 The esophagus was normal.                           The Z-line was regular and was found 35 cm from the                            incisors.                           Diffuse atrophic mucosa was found in the entire                            examined stomach. Biopsies were taken with a cold                            forceps for histology. Biopsies were taken with a                            cold forceps for Helicobacter pylori testing.                           Lymphangiectasia was present in the first portion                            of the duodenum and in the second portion of the                            duodenum. Biopsies for histology were taken with a  cold forceps for evaluation of celiac disease. Complications:            No immediate complications. Estimated Blood Loss:     Estimated blood loss was minimal. Impression:               - Normal esophagus.                           - Z-line regular, 35 cm from the incisors.                           - Gastric mucosal atrophy. Biopsied to exclude                            autoimmune gastritis.                           - Duodenal mucosal lymphangiectasia. Recommendation:           - Patient has a contact number available for                            emergencies. The signs and symptoms of potential                            delayed complications were discussed with the                            patient. Return to normal activities tomorrow.                            Written discharge instructions were provided to the                            patient.                           - Resume previous diet.                           - Continue present medications.                           -  Await pathology results. Napoleon Form, MD 10/16/2019 4:05:23 PM This report has been signed electronically.

## 2019-10-16 NOTE — Progress Notes (Signed)
Pt's states no medical or surgical changes since previsit or office visit.  VS-cw

## 2019-10-20 ENCOUNTER — Telehealth: Payer: Self-pay

## 2019-10-20 NOTE — Telephone Encounter (Signed)
  Follow up Call-  Call back number 10/16/2019  Post procedure Call Back phone  # [phone]  Permission to leave phone message Yes  Some recent data might be hidden     Patient questions:  Do you have a fever, pain , or abdominal swelling? No. Pain Score  0 *  Have you tolerated food without any problems? Yes.    Have you been able to return to your normal activities? Yes.    Do you have any questions about your discharge instructions: Diet   No. Medications  No. Follow up visit  No.  Do you have questions or concerns about your Care? No.  Actions: * If pain score is 4 or above: No action needed, pain <4.   1. Have you developed a fever since your procedure? no  2.   Have you had an respiratory symptoms (SOB or cough) since your procedure? no  3.   Have you tested positive for COVID 19 since your procedure no  4.   Have you had any family members/close contacts diagnosed with the COVID 19 since your procedure?  no   If yes to any of these questions please route to Laverna Peace, RN and Charlett Lango, RN

## 2019-10-26 ENCOUNTER — Telehealth: Payer: Self-pay | Admitting: Gastroenterology

## 2019-10-27 ENCOUNTER — Encounter: Payer: Self-pay | Admitting: Gastroenterology

## 2019-11-02 NOTE — Telephone Encounter (Signed)
Letter in My Chart for the patient and a copy mailed 10/27/19.

## 2019-12-06 ENCOUNTER — Other Ambulatory Visit: Payer: Self-pay | Admitting: Nurse Practitioner

## 2019-12-22 ENCOUNTER — Telehealth: Payer: BC Managed Care – PPO | Admitting: Emergency Medicine

## 2019-12-22 DIAGNOSIS — S61209A Unspecified open wound of unspecified finger without damage to nail, initial encounter: Secondary | ICD-10-CM

## 2019-12-22 MED ORDER — MUPIROCIN 2 % EX OINT: 1.0000 "application " | TOPICAL_OINTMENT | Freq: Two times a day (BID) | CUTANEOUS | 0 refills | Status: DC

## 2019-12-22 NOTE — Progress Notes (Signed)
E visit for Simple Cut/Laceration  We are sorry that you have had an injury. Here is how we plan to help!  Based on what you shared with me it looks like you have a simple laceration that does not need to be repaired with stitches or tissue glue.    I have prescribed you some topical antibiotic ointment to help prevent infection.  Continue to soak the finger in warm, hot water to help with drainage.   HOME CARE: . Clean the cut or scrape - Wash it well with soap and water. * avoid using hydrogen peroxide which may cause tissue damage, or impede wound healing.  . Stop the bleeding - If your cut or scrape is bleeding, press a clean cloth or bandage firmly on the area for 20 minutes. You can also help slow the bleeding by holding the cut above the level of your heart.   . Put a thin layer of Bacitracin antibiotic ointment on the cut or scrape. (this can be purchased at any local pharmacy- ask your pharmacist if you need assistance)   . Cover the cut or scrape with a bandage or gauze. Keep the bandage clean and dry. Change the bandage 1 to 2 times every day until your cut or scrape heals.   . Watch for signs that your cut or scrape is infected (redness, drainage, pain, warmth, swelling or fever)  Over the next 48 hours your wound should start to improve with less pain, less swelling and less redness. If you should develop increasing pain, swelling, redness, fever, pus from the wound you should be seen immediately to make sure this is not becoming infected.   WOUND CARE: Please keep a layer of antibiotic ointment (bacitracin preferred) on this wound at least twice a day for the next seven days and keep a sterile dressing over top of it. You may gently clean the wound with warm soap and water between dressing changes.  We strongly recommend that you have a medical provider reevaluate your wound within 2 to 3 days in person to make sure that it is healing appropriately.  Thank you for choosing an  e-visit. Your e-visit answers were reviewed by a board certified advanced clinical practitioner to complete your personal care plan. Depending upon the condition, your plan could have included both over the counter or prescription medications. Please review your pharmacy choice. Make sure the pharmacy is open so you can pick up prescription now. If there is a problem, you may contact your provider through [employer] and have the prescription routed to another pharmacy. Your safety is important to [place]. If you have drug allergies check your prescription carefully.  For the next 24 hours you can use MyChart to ask questions about today's visit, request a non-urgent call back, or ask for a work or school excuse.  You will get an email with a link to our survey asking about your experience. I hope that your e-visit has been valuable and will speed your recovery   Approximately 5 minutes was spent reviewing the patient's chart.

## 2020-03-26 ENCOUNTER — Other Ambulatory Visit: Payer: Self-pay | Admitting: Nurse Practitioner

## 2020-07-25 ENCOUNTER — Encounter: Payer: Self-pay | Admitting: Adult Health

## 2020-07-25 ENCOUNTER — Other Ambulatory Visit: Payer: Self-pay | Admitting: Adult Health

## 2020-07-25 DIAGNOSIS — M069 Rheumatoid arthritis, unspecified: Secondary | ICD-10-CM

## 2020-07-25 NOTE — Progress Notes (Signed)
I connected by phone with Joann Harrison on 07/25/2020, 2:59 PM to discuss the potential use of a new treatment, tixagevimab/cilgavimab, for pre-exposure prophylaxis for prevention of coronavirus disease 2019 (COVID-19) caused by the SARS-CoV-2 virus.  This patient is a 40 y.o. female that meets the FDA criteria for Emergency Use Authorization of tixagevimab/cilgavimab for pre-exposure prophylaxis of COVID-19 disease. Pt meets following criteria:  Age >12 yr and weight > 40kg  Not currently infected with SARS-CoV-2 and has no known recent exposure to an individual infected with SARS-CoV-2 AND o Who has moderate to severe immune compromise due to a medical condition or receipt of immunosuppressive medications or treatments and may not mount an adequate immune response to COVID-19 vaccination or  o Vaccination with any available COVID-19 vaccine, according to the approved or authorized schedule, is not recommended due to a history of severe adverse reaction (e.g., severe allergic reaction) to a COVID-19 vaccine(s) and/or COVID-19 vaccine component(s).  o Patient meets the following definition of mod-severe immune compromised status: 8. Other groups not previously mentioned: 4. on immunosuppressive therapy  I have spoken and communicated the following to the patient or parent/caregiver regarding COVID monoclonal antibody treatment:  1. FDA has authorized the emergency use of tixagevimab/cilgavimab for the pre-exposure prophylaxis of COVID-19 in patients with moderate-severe immunocompromised status, who meet above EUA criteria.  2. The significant known and potential risks and benefits of COVID monoclonal antibody, and the extent to which such potential risks and benefits are unknown.  3. Information on available alternative treatments and the risks and benefits of those alternatives, including clinical trials.  4. The patient or parent/caregiver has the option to accept or refuse COVID monoclonal  antibody treatment.  After reviewing this information with the patient, agree to receive tixagevimab/cilgavimab  Noreene Filbert, NP, 07/25/2020, 2:59 PM

## 2020-07-28 ENCOUNTER — Other Ambulatory Visit: Payer: Self-pay

## 2020-07-28 ENCOUNTER — Ambulatory Visit (INDEPENDENT_AMBULATORY_CARE_PROVIDER_SITE_OTHER): Payer: BC Managed Care – PPO

## 2020-07-28 DIAGNOSIS — M069 Rheumatoid arthritis, unspecified: Secondary | ICD-10-CM

## 2020-07-28 DIAGNOSIS — D8989 Other specified disorders involving the immune mechanism, not elsewhere classified: Secondary | ICD-10-CM

## 2020-07-28 MED ORDER — DIPHENHYDRAMINE HCL 50 MG/ML IJ SOLN: 50.0000 mg | Freq: Once | INTRAMUSCULAR | Status: AC | PRN

## 2020-07-28 MED ORDER — METHYLPREDNISOLONE SODIUM SUCC 125 MG IJ SOLR: 125.0000 mg | Freq: Once | INTRAMUSCULAR | Status: AC | PRN

## 2020-07-28 MED ORDER — CILGAVIMAB (PART OF EVUSHELD) INJECTION
300.0000 mg | Freq: Once | INTRAMUSCULAR | Status: AC
Start: 2020-07-28 — End: 2020-07-28
  Administered 2020-07-28: 300 mg via INTRAMUSCULAR
  Filled 2020-07-28: qty 3

## 2020-07-28 MED ORDER — EPINEPHRINE 0.3 MG/0.3ML IJ SOAJ: 0.3000 mg | Freq: Once | INTRAMUSCULAR | Status: AC | PRN

## 2020-07-28 MED ORDER — ALBUTEROL SULFATE HFA 108 (90 BASE) MCG/ACT IN AERS: 2.0000 | INHALATION_SPRAY | Freq: Once | RESPIRATORY_TRACT | Status: AC | PRN

## 2020-07-28 MED ORDER — TIXAGEVIMAB (PART OF EVUSHELD) INJECTION
300.0000 mg | Freq: Once | INTRAMUSCULAR | Status: AC
Start: 2020-07-28 — End: 2020-07-28
  Administered 2020-07-28: 300 mg via INTRAMUSCULAR
  Filled 2020-07-28: qty 3

## 2020-07-28 NOTE — Progress Notes (Signed)
Diagnosis: Autoimmune Disease  Provider:  Chilton Greathouse, MD  Procedure: Injection  Evusheld, Dose: 600mg , Site: intramuscular , left buttock x1, Right buttock x1  Discharge: Condition: Good, Destination: Home . AVS provided to patient.   Performed by:  , RN

## 2020-09-09 ENCOUNTER — Other Ambulatory Visit: Payer: Self-pay | Admitting: Nurse Practitioner

## 2020-09-09 NOTE — Telephone Encounter (Signed)
Patient needs office visit.  

## 2020-12-01 ENCOUNTER — Other Ambulatory Visit: Payer: Self-pay | Admitting: Nurse Practitioner

## 2021-05-23 ENCOUNTER — Other Ambulatory Visit: Payer: Self-pay

## 2021-05-23 ENCOUNTER — Encounter: Payer: Self-pay | Admitting: Internal Medicine

## 2021-05-23 ENCOUNTER — Ambulatory Visit: Payer: BC Managed Care – PPO | Admitting: Internal Medicine

## 2021-05-23 VITALS — BP 118/80 | HR 95 | Temp 98.7°F | Ht 64.0 in | Wt 216.0 lb

## 2021-05-23 DIAGNOSIS — M351 Other overlap syndromes: Secondary | ICD-10-CM

## 2021-05-23 DIAGNOSIS — G4719 Other hypersomnia: Secondary | ICD-10-CM | POA: Diagnosis not present

## 2021-05-23 DIAGNOSIS — R0602 Shortness of breath: Secondary | ICD-10-CM

## 2021-05-23 MED ORDER — ALBUTEROL SULFATE HFA 108 (90 BASE) MCG/ACT IN AERS: 2.0000 | INHALATION_SPRAY | Freq: Four times a day (QID) | RESPIRATORY_TRACT | 1 refills | Status: AC | PRN

## 2021-05-23 NOTE — Progress Notes (Signed)
The patient has been prescribed the inhaler albuterol. Inhaler technique was demonstrated to patient. The patient subsequently demonstrated correct technique.  

## 2021-05-23 NOTE — Progress Notes (Signed)
Joann Harrison    [id number]    April 11, 1980  Primary Care Physician:Shambley, Audie Box, NP  Referring Physician: Evaristo Bury, NP [address] [place],  [place] [address] Reason for Consultation: shortness of breath Date of Consultation: 05/23/2021  Chief complaint:   Chief Complaint  Patient presents with   Consult    Pt states she has had chronic SOB after being diagnosed with mixed connective tissue disease in 1997. Pt states she has a flare up about twice a year and states over the last 6 months, this has been happening more frequent.     HPI: Joann Harrison is a 41 y.o. woman with MCTD diagnosed 1997 who presents with shortness of breath.  Previously lived in [place]. Moved to [place] in 2010. Last saw pulmonologist in [place] in 2009 and has had PFTs, doesn't remember name. Has been on inhaler with albuterol in the past with exercise induced asthma. Childhood allergies.   Since going back to work 6 months ago, she has had four episodes of dyspnea. She describes having episodes of difficulty breathing in.  She was at work and started feeling "constricted," constantly yawning and trying to get a deep breath in. Symptoms last for 4 days and resolved spontaneously. They were pretty consistent over those four days.  She tried albuterol inhaler and it didn't seem to help.accompanied by chest tightness , but no wheezing or cough. No pain.   Cannot identify any triggers. Has had worsening in the past with allergies but that doesn't seem to be the case right now. Takes daily xyzal, singulair, flonase and astelin.   MCTD - has a rheumatologist Dr Dierdre Forth. Symptoms are related to RA with fatigue. Joint pains, which is asymmetrical. She is on methotrexate. Previously on rituxan, now on truxima.   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  She was treated with steroids then.   Social history:  Occupation: works as a  Comptroller at [employer].  Exposures: lives at home with her husband, no pets.  Smoking history: smoked 1/2 ppd for 3 years in college.   Social History   Occupational History   Not on file  Tobacco Use   Smoking status: Former    Packs/day: 0.50    Years: 3.00    Pack years: 1.50    Types: Cigarettes    Quit date: 04/02/2002    Years since quitting: 19.1   Smokeless tobacco: Never  Vaping Use   Vaping Use: Never used  Substance and Sexual Activity   Alcohol use: Yes    Comment: social   Drug use: No   Sexual activity: Not on file    Relevant family history:  Family History  Problem Relation Age of Onset   Asthma Mother    Arthritis Other        grandparent   Hyperlipidemia Other        parent & grandparent   Stroke Other        grandparent   Diabetes Other        other relative   Colon cancer Neg Hx    Colon polyps Neg Hx    Esophageal cancer Neg Hx    Rectal cancer Neg Hx    Stomach cancer Neg Hx     Past Medical History:  Diagnosis Date   ALLERGIC RHINITIS    Allergy    seasonal   Anxiety  Arthritis    RA- on biologicals for tx   ASTHMA    Asthma    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 Roberts Gaudy in [place]    Past Surgical History:  Procedure Laterality Date   HEMORRHOID SURGERY  1983     Physical Exam: Blood pressure 118/80, pulse 95, temperature 98.7 F (37.1 C), temperature source Oral, height 5\' 4"  (1.626 m), weight 216 lb (98 kg), SpO2 96 %. Gen:      No acute distress ENT:  mallampati IV, no nasal polyps, mucus membranes moist Lungs:    No increased respiratory effort, symmetric chest wall excursion, clear to auscultation bilaterally, no wheezes or crackles CV:         Regular rate and rhythm; no murmurs, rubs, or gallops.  No pedal  edema Abd:      + bowel sounds; soft, non-tender; no distension MSK: no acute synovitis of DIP or PIP joints, no mechanics hands.  Skin:      Warm and dry; dorusm of hands with eczema and raw irritated skin Neuro: normal speech, no focal facial asymmetry Psych: alert and oriented x3, normal mood and affect   Data Reviewed/Medical Decision Making:  Independent interpretation of tests: Imaging:  Review of patient's chest xray July 2010 images revealed no acute process. The patient's images have been independently reviewed by me.    PFTs: None on file No flowsheet data found.  Labs: CMP and CBC reviewed [facility] in Jan 2023 - wnl.  Abs Eosinophils 220 CO2 21  Immunization status:  Immunization History  Administered Date(s) Administered   Influenza Whole 02/23/2009   Influenza-Unspecified 01/22/2017, 01/13/2021   Td 04/03/2007   Unspecified SARS-COV-2 Vaccination 06/06/2019, 06/27/2019, 03/03/2020, 09/02/2020     I reviewed prior external note(s) from GI, family medicine  I reviewed the result(s) of the labs and imaging as noted above.   I have ordered PFTs  Assessment:  Shortness of breath Excessive daytime sleepiness and snoring MCTD Depression MIgraines  Plan/Recommendations: Differential diagnosis for dyspnea includes asthma, less likely neuromuscular weakness or ILD. Exam reassuring.  Will obtain PFTs.  Recommend symptoms diary Trial of albuterol Will order home sleep apnea test. I'll see her back after this.   We discussed disease management and progression at length today.    Return to Care: Return in about 4 weeks (around 06/20/2021).  Durel Salts, MD Pulmonary and Critical Care Medicine [facility] Office:[phone]  CC: Evaristo Bury, NP

## 2021-05-23 NOTE — Patient Instructions (Addendum)
Please schedule follow up scheduled with myself in 2-3 weeks.  If my schedule is not open yet, we will contact you with a reminder closer to that time. Please call [phone] if you haven't heard from [place] a month before.   Before your next visit I would like you to have: Full set of PFTs prior to visit  We will contact you with home sleep apnea test.   Take the albuterol rescue inhaler every 4 to 6 hours as needed for wheezing or shortness of breath. You can also take it 15 minutes before exercise or exertional activity. Side effects include heart racing or pounding, jitters or anxiety. If you have a history of an irregular heart rhythm, it can make this worse. Can also give some patients a hard time sleeping.  To inhale the aerosol using an inhaler, follow these steps:  Remove the protective dust cap from the end of the mouthpiece. If the dust cap was not placed on the mouthpiece, check the mouthpiece for dirt or other objects. Be sure that the canister is fully and firmly inserted in the mouthpiece. 2. If you are using the inhaler for the first time or if you have not used the inhaler in more than 14 days, you will need to prime it. You may also need to prime the inhaler if it has been dropped. Ask your pharmacist or check the manufacturer's information if this happens. To prime the inhaler, shake it well and then press down on the canister 4 times to release 4 sprays into the air, away from your face. Be careful not to get albuterol in your eyes. 3. Shake the inhaler well. 4. Breathe out as completely as possible through your mouth. 4. Hold the canister with the mouthpiece on the bottom, facing you and the canister pointing upward. Place the open end of the mouthpiece into your mouth. Close your lips tightly around the mouthpiece. 6. Breathe in slowly and deeply through the mouthpiece.At the same time, press down once on the container to spray the medication into your mouth. 7. Try to hold  your breath for 10 seconds. remove the inhaler, and breathe out slowly. 8. If you were told to use 2 puffs, wait 1 minute and then repeat steps 3-7. 9. Replace the protective cap on the inhaler. 10. Clean your inhaler regularly. Follow the manufacturer's directions carefully and ask your doctor or pharmacist if you have any questions about cleaning your inhaler.  Check the back of the inhaler to keep track of the total number of doses left on the inhaler.

## 2021-05-30 ENCOUNTER — Encounter: Payer: Self-pay | Admitting: Internal Medicine

## 2021-05-30 NOTE — Telephone Encounter (Signed)
Dr. Shearon Stalls please advise on patient mychart message. Thank you    Hi Dr. Shearon Stalls,   During my appointment last week you said that using my inhaler was the best thing I could do during one of the flare ups of my breathing issues. Im having one now that started yesterday morning, and Ive been using my inhaler but it has not provided much relief. Im still having to put in a lot of effort to get deep breaths in. Is there anything else I could do to try to get through this current situation? It is really disruptive to my work as well as my sleep right now. I have had to take prednisone many times over the last 25 years for an array of autoimmune symptoms and while I avoid it as much as I can because of the long term side effects, Im wondering if you think something like that would help.    Thanks, Joann Harrison

## 2021-06-06 ENCOUNTER — Other Ambulatory Visit: Payer: Self-pay

## 2021-06-06 ENCOUNTER — Ambulatory Visit: Payer: BC Managed Care – PPO | Admitting: Internal Medicine

## 2021-06-06 DIAGNOSIS — R0602 Shortness of breath: Secondary | ICD-10-CM | POA: Diagnosis not present

## 2021-06-06 LAB — PULMONARY FUNCTION TEST
DL/VA % pred: 129 %
DL/VA: 5.76 ml/min/mmHg/L
DLCO cor % pred: 105 %
DLCO cor: 23.13 ml/min/mmHg
DLCO unc % pred: 105 %
DLCO unc: 23.13 ml/min/mmHg
FEF 25-75 Post: 3.84 L/sec
FEF 25-75 Pre: 3.61 L/sec
FEF2575-%Change-Post: 6 %
FEF2575-%Pred-Post: 121 %
FEF2575-%Pred-Pre: 114 %
FEV1-%Change-Post: 0 %
FEV1-%Pred-Post: 86 %
FEV1-%Pred-Pre: 86 %
FEV1-Post: 2.62 L
FEV1-Pre: 2.63 L
FEV1FVC-%Change-Post: 4 %
FEV1FVC-%Pred-Pre: 105 %
FEV6-%Change-Post: -4 %
FEV6-%Pred-Post: 78 %
FEV6-%Pred-Pre: 82 %
FEV6-Post: 2.87 L
FEV6-Pre: 3 L
FEV6FVC-%Pred-Post: 101 %
FEV6FVC-%Pred-Pre: 101 %
FVC-%Change-Post: -4 %
FVC-%Pred-Post: 77 %
FVC-%Pred-Pre: 81 %
FVC-Post: 2.87 L
FVC-Pre: 3 L
Post FEV1/FVC ratio: 91 %
Post FEV6/FVC ratio: 100 %
Pre FEV1/FVC ratio: 87 %
Pre FEV6/FVC Ratio: 100 %
RV % pred: 81 %
RV: 1.28 L
TLC % pred: 84 %
TLC: 4.28 L

## 2021-06-06 NOTE — Progress Notes (Signed)
PFT done today. 

## 2021-06-08 ENCOUNTER — Encounter: Payer: Self-pay | Admitting: Internal Medicine

## 2021-06-08 ENCOUNTER — Other Ambulatory Visit: Payer: Self-pay

## 2021-06-08 ENCOUNTER — Ambulatory Visit: Payer: BC Managed Care – PPO | Admitting: Internal Medicine

## 2021-06-08 VITALS — BP 112/70 | HR 99 | Temp 98.2°F | Ht 64.0 in | Wt 220.0 lb

## 2021-06-08 DIAGNOSIS — R0602 Shortness of breath: Secondary | ICD-10-CM | POA: Diagnosis not present

## 2021-06-08 DIAGNOSIS — M069 Rheumatoid arthritis, unspecified: Secondary | ICD-10-CM | POA: Diagnosis not present

## 2021-06-08 NOTE — Patient Instructions (Signed)
Follow up with me as needed. I will communicate with Dr. Dierdre Forth.  ? ?Hopefully you will have your sleep study soon - I will call you with results and next steps once it's done.  ? ?

## 2021-06-08 NOTE — Progress Notes (Signed)
? ?      ?Joann Harrison    [id number]    Oct 18, 1980 ? ?Primary Care Physician:Dragnev, Alphonse Guild, NP ?Date of Appointment: 06/08/2021 ?Established Patient Visit ? ?Chief complaint:   ?Chief Complaint  ?Patient presents with  ? Follow-up  ?  She feels she is not improved with her shortness of breath since last OV.   ? ? ? ?HPI: ?Joann Harrison is a 41 y.o. woman with rheumatoid arthritis on plaquenil, mobic, methotrexate and truxima - which is rituxmab- ABBS who presents with shortness of breath.  ? ?Interval Updates: ?Here for follow up after PFTs which show normal pulmonary function.  ? ?She woke up last Monday with shortness of breath which was constant until Thursday evening. No worsening with movement. No improvement with inhaler therapy. She did feel a little better when she moved her knees up. Symptoms not associated with cough, wheezing, chest tightness. Symptoms resolved spontaneously with time. Symptoms were bad enough that she worked from home instead of going to work and wearing a mask.  ? ?I have reviewed the patient's family social and past medical history and updated as appropriate.  ? ?Past Medical History:  ?Diagnosis Date  ? ALLERGIC RHINITIS   ? Allergy   ? seasonal  ? Anxiety   ? Arthritis   ? RA- on biologicals for tx  ? ASTHMA   ? Asthma   ? 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 ?lupus overlap +antiDNA ab, +RNP, +Sm, +SSA titers; no other sx  ? Cataract   ? bilateral  ? DEPRESSION   ? MIGRAINE HEADACHE   ? Rheumatoid arthritis(714.0)   ? neg CCP, +RF - follows with Dr. Roberts Gaudy in [place]  ? ? ?Past Surgical History:  ?Procedure Laterality Date  ? HEMORRHOID SURGERY  1983  ? ? ?Family History  ?Problem Relation Age of Onset  ? Asthma Mother   ? Arthritis Other   ?     grandparent  ? Hyperlipidemia Other   ?     parent & grandparent  ? Stroke Other   ?     grandparent  ? Diabetes Other   ?     other relative  ? Colon cancer Neg Hx   ? Colon polyps Neg Hx   ? Esophageal cancer Neg  Hx   ? Rectal cancer Neg Hx   ? Stomach cancer Neg Hx   ? ? ?Social History  ? ?Occupational History  ? Not on file  ?Tobacco Use  ? Smoking status: Former  ?  Packs/day: 0.50  ?  Years: 3.00  ?  Pack years: 1.50  ?  Types: Cigarettes  ?  Quit date: 04/02/2002  ?  Years since quitting: 19.1  ? Smokeless tobacco: Never  ?Vaping Use  ? Vaping Use: Never used  ?Substance and Sexual Activity  ? Alcohol use: Yes  ?  Comment: social  ? Drug use: No  ? Sexual activity: Not on file  ? ? ? ?Physical Exam: ?Blood pressure 112/70, pulse 99, temperature 98.2 ?F (36.8 ?C), temperature source Oral, height  (1.626 m), weight 220 lb (99.8 kg), SpO2 97 %. ? ?Gen:      No acute distress ?ENT:  no nasal polyps, mucus membranes moist ?Lungs:    No increased respiratory effort, symmetric chest wall excursion, clear to auscultation bilaterally, no wheezes or crackles ?CV:         Regular rate and rhythm; no murmurs, rubs, or gallops.  No  pedal edema ?MSK: erythematous rash on dorsum of her hands ? ?Data Reviewed: ?Imaging: ?I have personally reviewed the chest xray 2010 is normal.  ? ?PFTs: ? ?PFT Results Latest Ref Rng & Units 06/06/2021  ?FVC-Pre L 3.00  ?FVC-Predicted Pre % 81  ?FVC-Post L 2.87  ?FVC-Predicted Post % 77  ?Pre FEV1/FVC % % 87  ?Post FEV1/FCV % % 91  ?FEV1-Pre L 2.63  ?FEV1-Predicted Pre % 86  ?FEV1-Post L 2.62  ?DLCO uncorrected ml/min/mmHg 23.13  ?DLCO UNC% % 105  ?DLCO corrected ml/min/mmHg 23.13  ?DLCO COR %Predicted % 105  ?DLVA Predicted % 129  ?TLC L 4.28  ?TLC % Predicted % 84  ?RV % Predicted % 81  ? ?I have personally reviewed the patient's PFTs and normal pulmonary function ? ?Labs: ? ?Immunization status: ?Immunization History  ?Administered Date(s) Administered  ? Influenza Inj Mdck Quad Pf 01/22/2017  ? Influenza Whole 02/23/2009  ? Influenza, Seasonal, Injecte, Preservative Fre 01/13/2020  ? Influenza-Unspecified 01/22/2017, 01/13/2021  ? Td 04/03/2007  ? Unspecified SARS-COV-2 Vaccination 06/06/2019,  06/27/2019, 03/03/2020, 09/02/2020  ? ? ?[facility] ? ?Assessment:  ?Shortness of breath ?Rheumatoid Arthritis ?Concern for OSA ? ?Plan/Recommendations: ?No improvement with albuterol inhaler. Would stop at this time.  ?Await results of sleep study. Will call her with these findings.  ?PFTs show normal pulmonary function. We discussed symptoms at length. Exam and pattern of symptoms are reassuring for not being related to ILD, autoimmune involvement of lung parenchyma or diaphgram weakness. Not consistent with asthma. I think we have confidently ruled these things out. She feels symptoms are similar to a joint flare of her RA. Would not feel comfortable describing prednisone for these symptoms without more objective data of a flare. Defer to Dr. Dierdre ForthBeekman on this. We discussed complementary medicine including stretching/movement, exercise, yoga, meditation and massage to help with symptoms.  ? ? ?Return to Care: ?Return if symptoms worsen or fail to improve. ? ? ?Durel SaltsNikita Alassane Kalafut, MD ?Pulmonary and Critical Care Medicine ?[facility] ?[phone] ? ? ? ? ? ?

## 2021-07-12 ENCOUNTER — Encounter: Payer: Self-pay | Admitting: Internal Medicine

## 2021-07-21 ENCOUNTER — Ambulatory Visit: Payer: BC Managed Care – PPO

## 2021-07-21 DIAGNOSIS — G4719 Other hypersomnia: Secondary | ICD-10-CM

## 2021-07-21 DIAGNOSIS — G4733 Obstructive sleep apnea (adult) (pediatric): Secondary | ICD-10-CM | POA: Diagnosis not present

## 2021-07-22 ENCOUNTER — Encounter: Payer: Self-pay | Admitting: Internal Medicine

## 2021-07-24 NOTE — Telephone Encounter (Signed)
Dr. Desai, please see mychart message sent by pt and advise. 

## 2021-08-02 ENCOUNTER — Telehealth: Payer: Self-pay | Admitting: Internal Medicine

## 2021-08-02 DIAGNOSIS — G4733 Obstructive sleep apnea (adult) (pediatric): Secondary | ICD-10-CM

## 2021-08-02 NOTE — Telephone Encounter (Signed)
-----   Message from Joann Harrison sent at 08/02/2021 12:59 PM EDT ----- ?Hello Dr Celine Mans  ?This patient's HST report is ready for review ? ?

## 2021-08-02 NOTE — Telephone Encounter (Signed)
Sleep study shows severe OSA. Please call and let her know we are prescribing CPAP therapy AutoCPAP 5-15 cm H20 medium face mask with tubing and supplies. Please also let her know to make an appointment with me within 30-90 days of receiving machine and using it. At this point she does not have a follow up with me.  ?

## 2021-08-09 ENCOUNTER — Encounter: Payer: Self-pay | Admitting: Internal Medicine

## 2021-08-09 NOTE — Telephone Encounter (Signed)
See MyChart encounter from 08/09/21.  ?

## 2021-08-10 NOTE — Telephone Encounter (Signed)
Estill Bamberg spoke with the pt and went over the sleep study results already today: ? ?Dierdre Highman, RN ?  ?   9:48 AM ?Note ?Spoke with pt and reviewed HST results as dictated by Dr. Shearon Stalls. Pt stated understanding. C-Pap order placed. Nothing further needed at this time  ?  ? ?

## 2021-08-10 NOTE — Telephone Encounter (Signed)
Spoke with pt and reviewed HST results as dictated by Dr. Celine Mans. Pt stated understanding. C-Pap order placed. Nothing further needed at this time.  ?

## 2021-08-10 NOTE — Telephone Encounter (Signed)
Patient is returning phone call. Patient phone number is [phone]. ?

## 2021-08-10 NOTE — Telephone Encounter (Signed)
ATC LVMTCB x 1  

## 2021-10-02 ENCOUNTER — Ambulatory Visit: Payer: BC Managed Care – PPO | Admitting: Internal Medicine

## 2021-10-02 ENCOUNTER — Encounter: Payer: Self-pay | Admitting: Internal Medicine

## 2021-10-02 VITALS — BP 116/72 | HR 90 | Ht 65.0 in | Wt 219.3 lb

## 2021-10-02 DIAGNOSIS — G4733 Obstructive sleep apnea (adult) (pediatric): Secondary | ICD-10-CM | POA: Diagnosis not present

## 2021-10-02 DIAGNOSIS — Z9989 Dependence on other enabling machines and devices: Secondary | ICD-10-CM

## 2021-10-02 NOTE — Patient Instructions (Signed)
Please schedule follow up scheduled with myself in 1 year.  If my schedule is not open yet, we will contact you with a reminder closer to that time. Please call [phone] if you haven't heard from [place] a month before.   Call if you need anything for your CPAP or if any new issues arises with breathing.   Have a great summer!

## 2021-10-02 NOTE — Progress Notes (Signed)
Joann Harrison    [id number]    [birth year]  Primary Care Physician:Dragnev, Alphonse Guild, NP Date of Appointment: 10/02/2021 Established Patient Visit  Chief complaint:   Chief Complaint  Patient presents with   Follow-up    Pt states CPAP usage is going well, still has SOB flare ups no cough/wheeze     HPI: Joann Harrison is a 41 y.o. woman with rheumatoid arthritis on plaquenil, mobic, methotrexate and truxima - which is rituxmab- ABBS who presents with shortness of breath.   Interval Updates: Here for follow up after start CPAP therapy.  Has had significant improvement in her sleep symptoms. Initially took some getting used to but now that she is using regularly, works well   CPAP download reviewed - Auto CPAP 100% adherence, with nasal pillows. Her mean AHI in the last two weeks is less than 5. (Down from 40/hr.)  Does have some chronic fatigue an associated dyspnea which she attributes to her autoimmune disease. Saw Dr. Dierdre Forth who checked some inflammatory markers and aldolase/CK which were wnl.   I have reviewed the patient's family social and past medical history and updated as appropriate.   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 Roberts Gaudy in [place]    Past Surgical History:  Procedure Laterality Date   HEMORRHOID SURGERY  1983    Family History  Problem Relation Age of Onset   Asthma Mother    Arthritis Other        grandparent   Hyperlipidemia Other        parent & grandparent   Stroke Other        grandparent   Diabetes Other        other relative   Colon cancer Neg Hx    Colon polyps  Neg Hx    Esophageal cancer Neg Hx    Rectal cancer Neg Hx    Stomach cancer Neg Hx     Social History   Occupational History   Not on file  Tobacco Use   Smoking status: Former    Packs/day: 0.50    Years: 3.00    Total pack years: 1.50    Types: Cigarettes    Quit date: 04/02/2002    Years since quitting: 19.5   Smokeless tobacco: Never  Vaping Use   Vaping Use: Never used  Substance and Sexual Activity   Alcohol use: Yes    Comment: social   Drug use: No   Sexual activity: Not on file     Physical Exam: Blood pressure 116/72, pulse 90, height 5\' 5"  (1.651 m), weight 219 lb 4.8 oz (99.5 kg), SpO2 96 %.  Gen:      No acute distress Lungs:    ctab no wheezes or crackles CV:        RRR  no mrg, no edema   Data Reviewed: Imaging: I have personally reviewed the chest xray 2010 is normal.   PFTs:  Latest Ref Rng & Units 06/06/2021    2:55 PM  PFT Results  FVC-Pre L 3.00   FVC-Predicted Pre % 81   FVC-Post L 2.87   FVC-Predicted Post % 77   Pre FEV1/FVC % % 87   Post FEV1/FCV % % 91   FEV1-Pre L 2.63   FEV1-Predicted Pre % 86   FEV1-Post L 2.62   DLCO uncorrected ml/min/mmHg 23.13   DLCO UNC% % 105   DLCO corrected ml/min/mmHg 23.13   DLCO COR %Predicted % 105   DLVA Predicted % 129   TLC L 4.28   TLC % Predicted % 84   RV % Predicted % 81    I have personally reviewed the patient's PFTs and normal pulmonary function  Labs:  Immunization status: Immunization History  Administered Date(s) Administered   Influenza Inj Mdck Quad Pf 01/22/2017   Influenza Whole 02/23/2009   Influenza, Seasonal, Injecte, Preservative Fre 01/13/2020   Influenza-Unspecified 01/22/2017, 01/13/2021   Td 04/03/2007   Unspecified SARS-COV-2 Vaccination 06/06/2019, 06/27/2019, 03/03/2020, 09/02/2020    External Records Personally Reviewed: [facility] rheumatology, CPAP download  Assessment:  OSA on CPAP - improved control Rheumatoid Arthritis with fatigue and  dyspnea  Plan/Recommendations:  Continue Auto CPAP 5-15 cm H20 with nasal pillows  She has excellent adherence and does benefit from treatment.   Return to Care: Return in about 1 year (around 10/03/2022).   Durel Salts, MD Pulmonary and Critical Care Medicine [facility] Office:[phone]

## 2021-10-30 ENCOUNTER — Ambulatory Visit: Payer: BC Managed Care – PPO | Admitting: Internal Medicine

## 2021-11-16 ENCOUNTER — Ambulatory Visit: Payer: BC Managed Care – PPO | Admitting: Nurse Practitioner

## 2021-11-16 ENCOUNTER — Encounter: Payer: Self-pay | Admitting: Nurse Practitioner

## 2021-11-16 VITALS — BP 128/88 | HR 100 | Ht 65.0 in | Wt 228.5 lb

## 2021-11-16 DIAGNOSIS — R1084 Generalized abdominal pain: Secondary | ICD-10-CM

## 2021-11-16 DIAGNOSIS — R194 Change in bowel habit: Secondary | ICD-10-CM | POA: Diagnosis not present

## 2021-11-16 MED ORDER — DICYCLOMINE HCL 10 MG PO CAPS: 10.0000 mg | ORAL_CAPSULE | Freq: Every day | ORAL | 3 refills | Status: DC

## 2021-11-16 MED ORDER — FAMOTIDINE 20 MG PO TABS: 20.0000 mg | ORAL_TABLET | Freq: Every day | ORAL | 1 refills | Status: DC

## 2021-11-16 NOTE — Progress Notes (Signed)
Chief Complaint:  mucous in stool    Assessment &  Plan   41 yo female with chronic intermittent loose stool alternating with normal BM. Now with intermittent episodes of narrow caliber stools with mucous and generalized lower abdominal spasm like discomfort relieved with defecation. Suspect she has IBS.   Normal colonoscopy in July 2021 done for similar symptoms Trial of Bentyl 10 mg qam. Dosing cautiously as I do not want to constipate her.  Continue daily fiber Asked to contact us in a few weeks with a condition update. She will call sooner for worsening symptoms, blood in stool , etc  # Lupus / RA. She is treated with Biologics, methotrexate and plaquenil.    HPI   Page Pucciarelli is a 42 y.o. female known to Dr.  Silverio Decamp with a past medical history significant for cholelithiasis, chronic loose stools, lupus /RA, OSA on CPAP. See PMH /PSH for additional history   Odaly was seen here in 2019 and 2021 for evaluation of loose stool, rectal bleeding, and abdominal pain. Nothing on EGD or colonoscopy to explain symptoms  Interval History:  Cova has chronic intermittent loose stools about twice a week, other than that she has a formed BM nearly every day. This is her normal bowel pattern and it hasn't changed. However, in the last few months she has started passing mucous in her stools. Additionally she has had three episodes of narrow caliber stools with associated generalized lower abdominal spasm type pain relieved with defecation. She had stopped fiber but resumed it in late June. She does feel like the fiber keeps her bowels regular.   Symptoms have not been associated with weight loss ( weight is up)  Previous GI Evaluation   July 2021 EGD for abdominal pain  - Normal esophagus. - Z-line regular, 35 cm from the incisors. - Gastric mucosal atrophy. Biopsied to exclude autoimmune gastritis. - Duodenal mucosal lymphangiectasia  July 2021 Colonoscopy  -Non-bleeding internal  hemorrhoids. - The examination was otherwise normal. - No specimens collected.  Imaging    Labs:     Latest Ref Rng & Units 07/05/2017   11:50 AM  CBC  WBC 4.0 - 10.5 K/uL 5.9   Hemoglobin 12.0 - 15.0 g/dL 14.6   Hematocrit 36.0 - 46.0 % 42.6   Platelets 150.0 - 400.0 K/uL 360.0        Latest Ref Rng & Units 07/05/2017   11:50 AM  Hepatic Function  Total Protein 6.0 - 8.3 g/dL 6.8   Albumin 3.5 - 5.2 g/dL 4.0   AST 0 - 37 U/L 17   ALT 0 - 35 U/L 23   Alk Phosphatase 39 - 117 U/L 56   Total Bilirubin 0.2 - 1.2 mg/dL 0.4      Past Medical History:  Diagnosis Date   ALLERGIC RHINITIS    Allergy    seasonal   Anxiety    Arthritis    RA- on biologicals for tx   ASTHMA    Asthma    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 Theodis Aguas in [place]   Sleep apnea, obstructive    uses CPAP at night    Past Surgical History:  Procedure Laterality Date   HEMORRHOID SURGERY  1983    Current Medications, Allergies, Family History and Social History were reviewed in [employer] record.     Current Outpatient Medications  Medication Sig Dispense Refill   albuterol (PROAIR HFA) 108 (90 Base) MCG/ACT inhaler Inhale 2 puffs into the lungs every 6 (six) hours as needed for wheezing. 8.5 each 1   azelastine (ASTELIN) 0.1 % nasal spray Place 2 sprays into both nostrils 2 (two) times daily. Use in each nostril as directed     cholecalciferol (VITAMIN D3) 25 MCG (1000 UNIT) tablet Take 1,000 Units by mouth daily.     cyclobenzaprine (FLEXERIL) 5 MG tablet TAKE 1 TAB BY MOUTH THREE TIMES DAILY     Dietary Management Product (RHEUMATE PO) Take by mouth.     famotidine (PEPCID) 20 MG tablet TAKE 1 TABLET BY MOUTH EVERY DAY IN THE MORNING 90 tablet 0   fluticasone  (FLONASE) 50 MCG/ACT nasal spray Place into both nostrils daily.     folic acid (FOLVITE) 1 MG tablet Take 1 mg by mouth daily.     HYDROcodone-acetaminophen (NORCO/VICODIN) 5-325 MG tablet Take 1 tablet by mouth every 6 (six) hours as needed.     hydroxychloroquine (PLAQUENIL) 200 MG tablet Take by mouth 2 (two) times daily.       levocetirizine (XYZAL) 5 MG tablet Take 5 mg by mouth every evening.     levonorgestrel (MIRENA, 52 MG,) 20 MCG/DAY IUD Take 1 device by intrauterine route.     meloxicam (MOBIC) 15 MG tablet Take 15 mg by mouth daily.       Meth-Hyo-M Bl-Na Phos-Ph Sal (URO-MP) 118 MG CAPS Take 1 capsule by mouth 3 (three) times daily as needed.     methotrexate (RHEUMATREX) 7.5 MG tablet Take 7.5 mg by mouth once a week. Caution" Chemotherapy. Protect from light.     montelukast (SINGULAIR) 10 MG tablet Take 10 mg by mouth at bedtime.     riTUXimab-abbs (TRUXIMA) 100 MG/10ML injection Inject into the vein.     sertraline (ZOLOFT) 100 MG tablet Take 100 mg by mouth daily.     Current Facility-Administered Medications  Medication Dose Route Frequency Provider Last Rate Last Admin   albuterol (VENTOLIN HFA) 108 (90 Base) MCG/ACT inhaler 2 puff  2 puff Inhalation Once PRN Causey, Charlestine Massed, NP       diphenhydrAMINE (BENADRYL) injection 50 mg  50 mg Intramuscular Once PRN Causey, Charlestine Massed, NP       EPINEPHrine (EPI-PEN) injection 0.3 mg  0.3 mg Intramuscular Once PRN Gardenia Phlegm, NP       methylPREDNISolone sodium succinate (SOLU-MEDROL) 125 mg/2 mL injection 125 mg  125 mg Intramuscular Once PRN Causey, Charlestine Massed, NP        Review of Systems: No chest pain. No shortness of breath. No urinary complaints.    Physical Exam  Wt Readings from Last 3 Encounters:  11/16/21 228 lb 8 oz (103.6 kg)  10/02/21 219 lb 4.8 oz (99.5 kg)  06/08/21 220 lb (99.8 kg)    BP 128/88   Pulse 100   Ht '5\' 5"'  (1.651 m)   Wt 228 lb 8 oz (103.6 kg)   BMI 38.02  kg/m  Constitutional:  Generally well appearing female in no acute distress. Psychiatric: Pleasant. Normal mood and affect. Behavior is normal. EENT: Pupils normal.  Conjunctivae are normal. No scleral icterus. Neck supple.  Cardiovascular: Normal rate, regular rhythm. No edema Pulmonary/chest: Effort normal and breath sounds normal. No wheezing,  rales or rhonchi. Abdominal: Soft, nondistended, nontender. Bowel sounds active throughout. There are no masses palpable. No hepatomegaly. Neurological: Alert and oriented to person place and time. Skin: Skin is warm and dry. No rashes noted.  Tye Savoy, NP  11/16/2021, 9:15 AM  Cc:  Ricki Rodriguez*

## 2021-11-16 NOTE — Patient Instructions (Signed)
_______________________________________________________  If you are age 40 or older, your body mass index should be between 23-30. Your Body mass index is 38.02 kg/m. If this is out of the aforementioned range listed, please consider follow up with your Primary Care Provider.  If you are age 42 or younger, your body mass index should be between 19-25. Your Body mass index is 38.02 kg/m. If this is out of the aformentioned range listed, please consider follow up with your Primary Care Provider.   ________________________________________________________  The [facility] providers would like to encourage you to use [facility] to communicate with providers for non-urgent requests or questions.  Due to long hold times on the telephone, sending your provider a message by [facility] may be a faster and more efficient way to get a response.  Please allow 48 business hours for a response.  Please remember that this is for non-urgent requests.  _______________________________________________________  Call in a few weeks with an update or sooner if medication is not tolerated.   It was a pleasure to see you today!  Thank you for trusting me with your gastrointestinal care!

## 2022-01-03 ENCOUNTER — Telehealth: Payer: Self-pay | Admitting: Nurse Practitioner

## 2022-01-03 NOTE — Telephone Encounter (Signed)
Inbound call from patient in regards to [employer]. Please give a call back to further advise.  Thank you

## 2022-01-03 NOTE — Telephone Encounter (Signed)
Left message for pt to call back. Mychart message also sent to pt.  

## 2022-01-04 ENCOUNTER — Other Ambulatory Visit: Payer: Self-pay

## 2022-01-04 MED ORDER — DICYCLOMINE HCL 10 MG PO CAPS: 10.0000 mg | ORAL_CAPSULE | Freq: Two times a day (BID) | ORAL | 3 refills | Status: DC

## 2022-01-10 ENCOUNTER — Other Ambulatory Visit: Payer: Self-pay | Admitting: Obstetrics and Gynecology

## 2022-01-10 DIAGNOSIS — R928 Other abnormal and inconclusive findings on diagnostic imaging of breast: Secondary | ICD-10-CM

## 2022-01-23 ENCOUNTER — Ambulatory Visit
Admission: RE | Admit: 2022-01-23 | Discharge: 2022-01-23 | Disposition: A | Discharge status: Home / Self Care | Payer: BC Managed Care – PPO | Source: Ambulatory Visit | Attending: Obstetrics and Gynecology | Admitting: Obstetrics and Gynecology

## 2022-01-23 ENCOUNTER — Other Ambulatory Visit: Payer: Self-pay | Admitting: Obstetrics and Gynecology

## 2022-01-23 DIAGNOSIS — R928 Other abnormal and inconclusive findings on diagnostic imaging of breast: Secondary | ICD-10-CM

## 2022-01-23 DIAGNOSIS — N6310 Unspecified lump in the right breast, unspecified quadrant: Secondary | ICD-10-CM

## 2022-01-31 ENCOUNTER — Ambulatory Visit
Admission: RE | Admit: 2022-01-31 | Discharge: 2022-01-31 | Disposition: A | Discharge status: Home / Self Care | Payer: BC Managed Care – PPO | Source: Ambulatory Visit | Attending: Obstetrics and Gynecology | Admitting: Obstetrics and Gynecology

## 2022-01-31 DIAGNOSIS — N6310 Unspecified lump in the right breast, unspecified quadrant: Secondary | ICD-10-CM

## 2022-02-11 ENCOUNTER — Other Ambulatory Visit: Payer: Self-pay | Admitting: Nurse Practitioner

## 2022-03-15 ENCOUNTER — Ambulatory Visit: Payer: BC Managed Care – PPO | Admitting: Nurse Practitioner

## 2022-04-01 ENCOUNTER — Other Ambulatory Visit: Payer: Self-pay | Admitting: Nurse Practitioner

## 2022-04-03 ENCOUNTER — Ambulatory Visit: Payer: BC Managed Care – PPO | Admitting: Nurse Practitioner

## 2022-04-12 ENCOUNTER — Ambulatory Visit: Payer: BC Managed Care – PPO | Admitting: Nurse Practitioner

## 2022-04-30 DIAGNOSIS — R87610 Atypical squamous cells of undetermined significance on cytologic smear of cervix (ASC-US): Secondary | ICD-10-CM | POA: Insufficient documentation

## 2022-05-02 ENCOUNTER — Encounter: Payer: Self-pay | Admitting: Nurse Practitioner

## 2022-05-02 ENCOUNTER — Ambulatory Visit: Payer: BC Managed Care – PPO | Admitting: Nurse Practitioner

## 2022-05-02 VITALS — BP 120/78 | HR 103 | Ht 65.0 in | Wt 229.2 lb

## 2022-05-02 DIAGNOSIS — K625 Hemorrhage of anus and rectum: Secondary | ICD-10-CM

## 2022-05-02 DIAGNOSIS — K582 Mixed irritable bowel syndrome: Secondary | ICD-10-CM

## 2022-05-02 MED ORDER — HYDROCORTISONE (PERIANAL) 2.5 % EX CREA: TOPICAL_CREAM | CUTANEOUS | 0 refills | Status: DC

## 2022-05-02 NOTE — Progress Notes (Signed)
Assessment    Patient profile:  Joann Harrison is a 42 y.o. female known to Dr. Silverio Decamp with a past medical history of cholelithiasis, chronic loose stools, lupus /RA, OSA on CPAP . See PMH /PSH for additional history  # 42 year old female with chronically altered bowel habits with associated abdominal pain. Suspect IBS.   # Scant painless rectal bleeding with bowel movements x 2 occasions ( one in November and once earlier this month) Suspect hemorrhoidal bleeding.  She is up-to-date on colon cancer screening, last colonoscopy July 2021 with findings of hemorrhoids   # Colon cancer screening, she is current.  Next 10 year screening colonoscopy due October 29, 2029  Plan   Ok to continue twice daily Dicyclomine since it helps. However,  would try to see if taking it twice daily on an as needed basis is sufficient. The lowest effective dose of is our goal.  Resume daily Citrucel if able to locate in the stores ( there has been a Producer, television/film/video) Anusol cream PR x 10 nights. She will follow up in office for any recurrent rectal bleeding after course of treatment.  HPI    Chief complaint: bowel changes, minor rectal bleeding on two occasions.    Oni has been seen here several times for altered bowel habits with associated mucous and abdominal pain. She was last seen August 2023, at that time for evaluation of ongoing chronic intermittent loose stools , some narrow caliber stools at times and lower abdominal spasm-like pain.  She was given a trial of Bentyl, fiber was continued.  She called the office in September.  The Bentyl was helping but inquired about a dose increase which we did.  She called back late November with increased IBS symptoms.  She was given a office appointment   Interval History:  Amarea continues to have loose stool alternating with formed stools and occasionally constipated stools.  She still has mucus in her stools at times.  One time in November and another time  earlier this month she had a scant amount of blood in the mucous. Missey has previously tried steroid supp for hemorrhoids but used it only a couple of days as it was uncomfortable. A couple of times her stools have had some "pale spots". She is pleased with bentyl as it helps with abdominal pain. She is taking it twice daily.   Faithe used to take Citrucel which was helpful but then she was unable to find it in the stores. Changed to Benefiber but it caused gas. She stopped it a couple of weeks ago. She is going to try to find Citrucel again.   Lujean is taking daily Famotidine because she requires NSAIDs. Currently taking Mobic TIW. No black stools or upper GI symptoms.    Previous GI Evaluation   July 2021 EGD for abdominal pain  - Normal esophagus. - Z-line regular, 35 cm from the incisors. - Gastric mucosal atrophy. Biopsied to exclude autoimmune gastritis. - Duodenal mucosal lymphangiectasia   July 2021 Colonoscopy  -Non-bleeding internal hemorrhoids. - The examination was otherwise normal. - No specimens collected.  Diagnosis 1. Surgical [P], duodenum - DUODENAL MUCOSA WITH NO SPECIFIC HISTOPATHOLOGIC CHANGES - NEGATIVE FOR INCREASED INTRAEPITHELIAL LYMPHOCYTES OR VILLOUS ARCHITECTURAL CHANGES 2. Surgical [P], gastric - GASTRIC ANTRAL AND OXYNTIC MUCOSA WITH NO SPECIFIC HISTOPATHOLOGIC CHANGES - WARTHIN STARRY STAIN IS NEGATIVE FOR HELICOBACTER PYLORI    Labs:     Latest Ref Rng & Units 07/05/2017   11:50 AM  CBC  WBC 4.0 - 10.5 K/uL 5.9   Hemoglobin 12.0 - 15.0 g/dL 14.6   Hematocrit 36.0 - 46.0 % 42.6   Platelets 150.0 - 400.0 K/uL 360.0        Latest Ref Rng & Units 07/05/2017   11:50 AM  Hepatic Function  Total Protein 6.0 - 8.3 g/dL 6.8   Albumin 3.5 - 5.2 g/dL 4.0   AST 0 - 37 U/L 17   ALT 0 - 35 U/L 23   Alk Phosphatase 39 - 117 U/L 56   Total Bilirubin 0.2 - 1.2 mg/dL 0.4      Past Medical History:  Diagnosis Date   ALLERGIC RHINITIS    Allergy     seasonal   Anxiety    Arthritis    RA- on biologicals for tx   ASTHMA    Asthma    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 Theodis Aguas in [place]   Sleep apnea, obstructive    uses CPAP at night    Past Surgical History:  Procedure Laterality Date   HEMORRHOID SURGERY  1983    Current Medications, Allergies, Family History and Social History were reviewed in [facility] record.     Current Outpatient Medications  Medication Sig Dispense Refill   albuterol (PROAIR HFA) 108 (90 Base) MCG/ACT inhaler Inhale 2 puffs into the lungs every 6 (six) hours as needed for wheezing. 8.5 each 1   azelastine (ASTELIN) 0.1 % nasal spray Place 2 sprays into both nostrils 2 (two) times daily. Use in each nostril as directed     cholecalciferol (VITAMIN D3) 25 MCG (1000 UNIT) tablet Take 1,000 Units by mouth daily.     cyclobenzaprine (FLEXERIL) 5 MG tablet TAKE 1 TAB BY MOUTH THREE TIMES DAILY     dicyclomine (BENTYL) 10 MG capsule TAKE 1 CAPSULE (10 MG TOTAL) BY MOUTH 2 (TWO) TIMES DAILY. TAKE BEFORE BREAKFAST AND BEFORE DINNER. 180 capsule 1   Dietary Management Product (RHEUMATE PO) Take by mouth.     famotidine (PEPCID) 20 MG tablet Take 1 tablet (20 mg total) by mouth daily. 90 tablet 1   fluticasone (FLONASE) 50 MCG/ACT nasal spray Place into both nostrils daily.     folic acid (FOLVITE) 1 MG tablet Take 1 mg by mouth daily.     HYDROcodone-acetaminophen (NORCO/VICODIN) 5-325 MG tablet Take 1 tablet by mouth every 6 (six) hours as needed.     hydroxychloroquine (PLAQUENIL) 200 MG tablet Take by mouth 2 (two) times daily.       levocetirizine (XYZAL) 5 MG tablet Take 5 mg by mouth every evening.     levonorgestrel (MIRENA, 52 MG,) 20 MCG/DAY IUD Take 1 device by intrauterine  route.     meloxicam (MOBIC) 15 MG tablet Take 15 mg by mouth daily.       Meth-Hyo-M Bl-Na Phos-Ph Sal (URO-MP) 118 MG CAPS Take 1 capsule by mouth 3 (three) times daily as needed.     methotrexate (RHEUMATREX) 7.5 MG tablet Take 7.5 mg by mouth once a week. Caution" Chemotherapy. Protect from light.     montelukast (SINGULAIR) 10 MG tablet Take 10 mg by mouth at bedtime.     riTUXimab-abbs (TRUXIMA) 100 MG/10ML injection Inject into the vein.  sertraline (ZOLOFT) 100 MG tablet Take 100 mg by mouth daily.     Current Facility-Administered Medications  Medication Dose Route Frequency Provider Last Rate Last Admin   albuterol (VENTOLIN HFA) 108 (90 Base) MCG/ACT inhaler 2 puff  2 puff Inhalation Once PRN Causey, Charlestine Massed, NP       diphenhydrAMINE (BENADRYL) injection 50 mg  50 mg Intramuscular Once PRN Causey, Charlestine Massed, NP       EPINEPHrine (EPI-PEN) injection 0.3 mg  0.3 mg Intramuscular Once PRN Gardenia Phlegm, NP       methylPREDNISolone sodium succinate (SOLU-MEDROL) 125 mg/2 mL injection 125 mg  125 mg Intramuscular Once PRN Causey, Charlestine Massed, NP        Review of Systems: No chest pain. No shortness of breath. No urinary complaints.    Physical Exam  Wt Readings from Last 3 Encounters:  11/16/21 228 lb 8 oz (103.6 kg)  10/02/21 219 lb 4.8 oz (99.5 kg)  06/08/21 220 lb (99.8 kg)    BP 120/78   Pulse (!) 103   Ht 5\' 5"  (1.651 m)   Wt 229 lb 3.2 oz (104 kg)   SpO2 97%   BMI 38.14 kg/m  Constitutional:  Generally well appearing female in no acute distress. Psychiatric: Pleasant. Normal mood and affect. Behavior is normal. EENT: Pupils normal.  Conjunctivae are normal. No scleral icterus. Neck supple.  Cardiovascular: Normal rate, regular rhythm.  Pulmonary/chest: Effort normal and breath sounds normal. No wheezing, rales or rhonchi. Abdominal: Soft, nondistended, nontender. Bowel sounds active throughout. There are no masses palpable. No  hepatomegaly. Neurological: Alert and oriented to person place and time. Musculoskeletal:  Skin: Skin is warm and dry. No rashes noted.  Tye Savoy, NP  05/02/2022, 8:04 AM

## 2022-05-02 NOTE — Patient Instructions (Addendum)
_______________________________________________________  If your blood pressure at your visit was 140/90 or greater, please contact your primary care physician to follow up on this.  _______________________________________________________  If you are age 41 or older, your body mass index should be between 23-30. Your Body mass index is 38.14 kg/m. If this is out of the aforementioned range listed, please consider follow up with your Primary Care Provider.  If you are age 58 or younger, your body mass index should be between 19-25. Your Body mass index is 38.14 kg/m. If this is out of the aformentioned range listed, please consider follow up with your Primary Care Provider.   ________________________________________________________  The [facility] providers would like to encourage you to use [facility] to communicate with providers for non-urgent requests or questions.  Due to long hold times on the telephone, sending your provider a message by [facility] may be a faster and more efficient way to get a response.  Please allow 48 business hours for a response.  Please remember that this is for non-urgent requests.  _______________________________________________________   Madaline Brilliant to continue twice daily Dicyclomine but would try to see if taking it twice daily on as needed basis is sufficient. The lowest effective dose of medication is our goal.   Resume daily Citrucel if able to locate in the stores ( there has been a Producer, television/film/video)  Hemorrhoid medication: Anusol Cream 2.5% for 10 days Directions:  Steroid cream / ointment can be used both internally and externally for the anal area. For application inside the rectum a nozzle is provided with the cream / ointment. Screw on the nozzle.  Gently insert into the rectum .  Squeeze out a small amount of cream / ointment. You probably will not feel the cream / ointment being delivered into the rectum but that is normal and doesn't mean you are not getting  enough.   Gently withdraw the nozzle.  Clean the nozzle thoroughly with soap and water after each use.  It was a pleasure to see you today!  Thank you for trusting me with your gastrointestinal care!           Follow up in office if there is any recurrent rectal bleeding after treatment.

## 2022-05-10 ENCOUNTER — Other Ambulatory Visit: Payer: Self-pay | Admitting: Nurse Practitioner

## 2022-10-03 ENCOUNTER — Other Ambulatory Visit: Payer: Self-pay | Admitting: Nurse Practitioner

## 2022-11-07 ENCOUNTER — Other Ambulatory Visit: Payer: Self-pay | Admitting: Nurse Practitioner

## 2022-12-05 ENCOUNTER — Ambulatory Visit: Payer: BC Managed Care – PPO | Admitting: Internal Medicine

## 2022-12-05 ENCOUNTER — Encounter: Payer: Self-pay | Admitting: Internal Medicine

## 2022-12-05 VITALS — BP 118/68 | HR 96 | Ht 65.0 in | Wt 234.8 lb

## 2022-12-05 DIAGNOSIS — G4733 Obstructive sleep apnea (adult) (pediatric): Secondary | ICD-10-CM

## 2022-12-05 NOTE — Patient Instructions (Signed)
It was a pleasure to see you today!  Please schedule follow up scheduled with myself in 1 year.  If my schedule is not open yet, we will contact you with a reminder closer to that time. Please call [phone] if you haven't heard from [place] a month before, and always call us sooner if issues or concerns arise. You can also send [place] a message through MyChart, but but aware that this is not to be used for urgent issues and it may take up to 5-7 days to receive a reply.   Glad the CPAP is working well! Continue current settings.

## 2022-12-05 NOTE — Progress Notes (Signed)
Joann Harrison    [id number]    1980-05-29  Primary Care Physician:Dragnev, Alphonse Guild, NP Date of Appointment: 12/05/2022 Established Patient Visit  Chief complaint:   Chief Complaint  Patient presents with   Follow-up    Pt states she has been doing well. Using cpap , no concerns      HPI: Joann Harrison is a 42 y.o. woman with rheumatoid arthritis on plaquenil, mobic, methotrexate and truxima - which is rituxmab- ABBS with OSA on CPAP.   Interval Updates: Here for CPAP follow up.  Download reviewed - 100% adherence, minimal leak median AHI 2.9. median pressure 9.8 cm H20. On auto cpap 5-15.   Having some insomnia and trouble falling asleep. Trying to practice good sleep hygiene.   Switching from truxima to rituximab has made a big difference in her autoimmune symptoms.   I have reviewed the patient's family social and past medical history and updated as appropriate.   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 Roberts Gaudy in [place]   Sleep apnea, obstructive    uses CPAP at night    Past Surgical History:  Procedure Laterality Date   HEMORRHOID SURGERY  1983    Family History  Problem Relation Age of Onset   Asthma Mother    Diabetes Other        other relative   Arthritis Other        grandparent   Hyperlipidemia Other        parent & grandparent   Stroke Other        grandparent   Colon cancer Neg Hx    Colon polyps Neg Hx    Esophageal cancer Neg Hx    Rectal cancer Neg Hx    Stomach cancer Neg Hx    Pancreatic cancer Neg Hx     Social History   Occupational History   Not on file   Tobacco Use   Smoking status: Former    Current packs/day: 0.00    Average packs/day: 0.5 packs/day for 3.0 years (1.5 ttl pk-yrs)    Types: Cigarettes    Start date: 04/03/1999    Quit date: 04/02/2002    Years since quitting: 20.6   Smokeless tobacco: Never  Vaping Use   Vaping status: Never Used  Substance and Sexual Activity   Alcohol use: Yes    Comment: social   Drug use: No   Sexual activity: Not on file     Physical Exam: Blood pressure 118/68, pulse 96, height 5\' 5"  (1.651 m), weight 234 lb 12.8 oz (106.5 kg), SpO2 98%.  Gen:      No distress, obese Lungs:    ctab no wheeze CV:        RRR no mrg, no edema   Data Reviewed: Imaging: I have personally reviewed the chest xray 2010 is normal.   PFTs:     Latest Ref Rng & Units 06/06/2021    2:55 PM  PFT  Results  FVC-Pre L 3.00   FVC-Predicted Pre % 81   FVC-Post L 2.87   FVC-Predicted Post % 77   Pre FEV1/FVC % % 87   Post FEV1/FCV % % 91   FEV1-Pre L 2.63   FEV1-Predicted Pre % 86   FEV1-Post L 2.62   DLCO uncorrected ml/min/mmHg 23.13   DLCO UNC% % 105   DLCO corrected ml/min/mmHg 23.13   DLCO COR %Predicted % 105   DLVA Predicted % 129   TLC L 4.28   TLC % Predicted % 84   RV % Predicted % 81    I have personally reviewed the patient's PFTs and normal pulmonary function  Labs:  Immunization status: Immunization History  Administered Date(s) Administered   Influenza Inj Mdck Quad Pf 01/22/2017   Influenza Whole 02/23/2009   Influenza, Seasonal, Injecte, Preservative Fre 01/13/2020   Influenza-Unspecified 01/22/2017, 01/13/2021   Td 04/03/2007   Unspecified SARS-COV-2 Vaccination 06/06/2019, 06/27/2019, 03/03/2020, 09/02/2020    External Records Personally Reviewed: cpap download.   Assessment:  OSA on CPAP - controlled Rheumatoid Arthritis with fatigue and dyspnea  Plan/Recommendations:  continue Auto CPAP 5-15 cm H20 with nasal pillows  She has excellent adherence and does benefit from  treatment.  Discussed spending time outside during daylight hours to help with circadian rhythm cycling.   Return to Care: Return in about 1 year (around 12/05/2023).   Durel Salts, MD Pulmonary and Critical Care Medicine [facility] Office:[phone]

## 2023-04-03 ENCOUNTER — Other Ambulatory Visit: Payer: Self-pay | Admitting: Nurse Practitioner

## 2023-05-12 ENCOUNTER — Other Ambulatory Visit: Payer: Self-pay | Admitting: Nurse Practitioner

## 2023-08-03 ENCOUNTER — Other Ambulatory Visit: Payer: Self-pay | Admitting: Nurse Practitioner

## 2023-09-01 ENCOUNTER — Other Ambulatory Visit: Payer: Self-pay | Admitting: Gastroenterology

## 2023-09-05 ENCOUNTER — Other Ambulatory Visit: Payer: Self-pay | Admitting: Gastroenterology

## 2023-09-30 ENCOUNTER — Other Ambulatory Visit: Payer: Self-pay | Admitting: Nurse Practitioner

## 2023-10-08 ENCOUNTER — Telehealth: Payer: Self-pay | Admitting: Gastroenterology

## 2023-10-08 MED ORDER — DICYCLOMINE HCL 10 MG PO CAPS: 10.0000 mg | ORAL_CAPSULE | Freq: Two times a day (BID) | ORAL | 2 refills | Status: DC

## 2023-10-08 NOTE — Telephone Encounter (Signed)
 Requesting medication refill for dicyclomine  sent into [facility] on [address].   Patient scheduled for next available f/u with PA.   Please advise.

## 2023-10-08 NOTE — Telephone Encounter (Signed)
 Sent patients dicyclomine  to requested pharmacy

## 2023-11-21 ENCOUNTER — Ambulatory Visit: Admitting: Gastroenterology

## 2023-12-21 ENCOUNTER — Other Ambulatory Visit: Payer: Self-pay | Admitting: Gastroenterology

## 2024-01-07 ENCOUNTER — Ambulatory Visit: Admitting: Gastroenterology

## 2024-01-07 ENCOUNTER — Encounter: Payer: Self-pay | Admitting: Gastroenterology

## 2024-01-07 VITALS — BP 118/88 | HR 103 | Ht 65.0 in | Wt 241.0 lb

## 2024-01-07 DIAGNOSIS — K582 Mixed irritable bowel syndrome: Secondary | ICD-10-CM

## 2024-01-07 NOTE — Patient Instructions (Signed)
 Please purchase the following medications over the counter and take as directed: IBgard, take 2 capsules as directed.  We are giving you a Low-FODMAP diet handout today. FODMAPs are short-chain carbohydrates (sugars) that are highly fermentable, which means that they go through chemical changes in the GI system, and are poorly absorbed during digestion. When FODMAPs reach the colon (large intestine), bacteria ferment these sugars, turning them into gas and chemicals. This stretches the walls of the colon, causing abdominal bloating, distension, cramping, pain, and/or changes in bowel habits in many patients with IBS. FODMAPs are not unhealthy or harmful, but may exacerbate GI symptoms in those with sensitive GI tracts.  Follow up in 1 year or sooner if needed.  Thank you for trusting me with your gastrointestinal care!   Camie Furbish, PA-C   _______________________________________________________  If your blood pressure at your visit was 140/90 or greater, please contact your primary care physician to follow up on this.  _______________________________________________________  If you are age 42 or older, your body mass index should be between 23-30. Your Body mass index is 40.1 kg/m. If this is out of the aforementioned range listed, please consider follow up with your Primary Care Provider.  If you are age 27 or younger, your body mass index should be between 19-25. Your Body mass index is 40.1 kg/m. If this is out of the aformentioned range listed, please consider follow up with your Primary Care Provider.   ________________________________________________________  The [facility] providers would like to encourage you to use MYCHART to communicate with providers for non-urgent requests or questions.  Due to long hold times on the telephone, sending your provider a message by [facility] may be a faster and more efficient way to get a response.  Please allow 48 business hours for a response.   Please remember that this is for non-urgent requests.  _______________________________________________________  [facility] is using a team-based approach to care.  Your team is made up of your doctor and two to three APPS. Our APPS (Nurse Practitioners and Physician Assistants) work with your physician to ensure care continuity for you. They are fully qualified to address your health concerns and develop a treatment plan. They communicate directly with your gastroenterologist to care for you. Seeing the Advanced Practice Practitioners on your physician's team can help you by facilitating care more promptly, often allowing for earlier appointments, access to diagnostic testing, procedures, and other specialty referrals.

## 2024-01-07 NOTE — Progress Notes (Signed)
 Joann Harrison [id number] 1980-08-22   Chief Complaint: IBS, GERD  Referring Provider: Kathlyne Romualdo Franco* Primary GI MD: Dr. Shila  HPI: Joann Harrison is a 43 y.o. female with past medical history of anxiety/depression, lupus/rheumatoid arthritis on Biologics, asthma, migraines, OSA on CPAP who presents today for medication refill.    Last seen in office 05/02/2022 by Vina Dasen, NP.  Noted to have chronically altered bowel habits with associated abdominal pain.  Suspected IBS.  Had scant painless rectal bleeding with bowel movements on 2 occasions, suspected hemorrhoidal bleeding.  Up-to-date on colon cancer screening with last colonoscopy July 2021 with finding of hemorrhoids and due for repeat 2031.  Was advised to continue twice daily dicyclomine  as needed with goal of lowest effective dose.  Advised to resume daily Citrucel, and Anusol  cream per rectum for 10 nights for treatment of hemorrhoids.   Patient states she is doing well.  Following last visit had gone down to dicyclomine  once a day with good control and symptoms overall, but in January her husband suffered a stroke and with the increase stress she had noticed increased abdominal pain and spasms.  Went back to twice daily dosing.  Feeling good on this dose, and though she still has diarrhea about once a week she feels her symptoms are manageable at this time.  Prior to starting dicyclomine  was alternating between diarrhea and constipation.  She did also start taking Citrucel in the combination of this with dicyclomine  has worked well for her.  Rarely has constipation now.  Most of the time she has normal bowel movements, occasionally loose stools, and rarely watery diarrhea.  Denies any further rectal bleeding since last visit.  Denies family history of colon cancer.  When she does get abdominal pain it is crampy in nature, near her umbilicus.  Sometimes hard to distinguish from uterine cramps.  States that she had an IUD  placed and since then has had worsening menstrual cramps and bleeding.  She is taking Pepcid , mainly because she is on meloxicam for rheumatoid arthritis/joint pains.  Denies any heartburn or acid reflux.  If she takes meloxicam without Pepcid  she can have some upper abdominal soreness.  Denies any dysphagia.  Denies melena.  As long as she takes Pepcid  is not having any discomfort with meloxicam.  Previous GI Procedures/Imaging   EGD July 2021 for abdominal pain  - Normal esophagus. - Z-line regular, 35 cm from the incisors. - Gastric mucosal atrophy. Biopsied to exclude autoimmune gastritis. - Duodenal mucosal lymphangiectasia Path: 1. Surgical [P], duodenum - DUODENAL MUCOSA WITH NO SPECIFIC HISTOPATHOLOGIC CHANGES - NEGATIVE FOR INCREASED INTRAEPITHELIAL LYMPHOCYTES OR VILLOUS ARCHITECTURAL CHANGES 2. Surgical [P], gastric - GASTRIC ANTRAL AND OXYNTIC MUCOSA WITH NO SPECIFIC HISTOPATHOLOGIC CHANGES - WARTHIN STARRY STAIN IS NEGATIVE FOR HELICOBACTER PYLORI   Colonoscopy July 2021  - Non-bleeding internal hemorrhoids. - The examination was otherwise normal. - No specimens collected. 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 Miquel in [place]  Sleep apnea, obstructive    uses CPAP at night    Past Surgical History:  Procedure Laterality Date   HEMORRHOID SURGERY  1983    Current Outpatient Medications  Medication Sig Dispense Refill   albuterol  (PROAIR  HFA) 108 (90 Base) MCG/ACT inhaler Inhale 2 puffs into the lungs every 6 (six) hours as needed for wheezing. 8.5 each 1   azelastine (ASTELIN)  0.1 % nasal spray Place 2 sprays into both nostrils 2 (two) times daily. Use in each nostril as directed     cholecalciferol (VITAMIN D3) 25 MCG (1000 UNIT) tablet Take 1,000 Units by mouth daily.     cyclobenzaprine (FLEXERIL) 5 MG tablet TAKE 1 TAB BY MOUTH THREE TIMES DAILY     dicyclomine  (BENTYL ) 10 MG capsule Take 1 capsule (10 mg total) by mouth 2 (two) times daily. Please schedule a yearly follow up for further refills.  Thank you 60 capsule 2   Dietary Management Product (RHEUMATE PO) Take by mouth.     famotidine  (PEPCID ) 20 MG tablet TAKE 1 TABLET (20 MG TOTAL) BY MOUTH DAILY. PLEASE SCHEDULE A YEARLY FOLLOW UP FOR FURTHER REFILLS. THANK YOU 90 tablet 0   fluticasone  (FLONASE ) 50 MCG/ACT nasal spray Place into both nostrils daily.     folic acid (FOLVITE) 1 MG tablet Take 1 mg by mouth daily.     HYDROcodone -acetaminophen (NORCO/VICODIN) 5-325 MG tablet Take 1 tablet by mouth every 6 (six) hours as needed.     hydroxychloroquine (PLAQUENIL) 200 MG tablet Take by mouth 2 (two) times daily.       levocetirizine (XYZAL) 5 MG tablet Take 5 mg by mouth every evening.     levonorgestrel (MIRENA, 52 MG,) 20 MCG/DAY IUD Take 1 device by intrauterine route.     meloxicam (MOBIC) 15 MG tablet Take 15 mg by mouth daily.       Meth-Hyo-M Bl-Na Phos-Ph Sal (URO-MP) 118 MG CAPS Take 1 capsule by mouth 3 (three) times daily as needed.     methotrexate (RHEUMATREX) 7.5 MG tablet Take 7.5 mg by mouth once a week. Caution Chemotherapy. Protect from light.     montelukast (SINGULAIR) 10 MG tablet Take 10 mg by mouth at bedtime.     sertraline (ZOLOFT) 100 MG tablet Take 100 mg by mouth daily.     Current Facility-Administered Medications  Medication Dose Route Frequency Provider Last Rate Last Admin   albuterol  (VENTOLIN  HFA) 108 (90 Base) MCG/ACT inhaler 2 puff  2 puff Inhalation Once PRN Causey, Lindsey Cornetto, NP       diphenhydrAMINE  (BENADRYL ) injection 50 mg  50 mg Intramuscular Once PRN  Causey, Morna Pickle, NP       EPINEPHrine  (EPI-PEN) injection 0.3 mg  0.3 mg Intramuscular Once PRN Causey, Lindsey Cornetto, NP       methylPREDNISolone  sodium succinate (SOLU-MEDROL ) 125 mg/2 mL injection 125 mg  125 mg Intramuscular Once PRN Crawford Morna Pickle, NP        Allergies as of 01/07/2024 - Review Complete 12/05/2022  Allergen Reaction Noted   Aspirin Other (See Comments)    Clindamycin /lincomycin  07/05/2017   Penicillins Swelling 04/08/2011   Sulfonamide derivatives Swelling     Family History  Problem Relation Age of Onset   Asthma Mother    Diabetes Other        other relative   Arthritis Other        grandparent   Hyperlipidemia Other        parent & grandparent  Stroke Other        grandparent   Colon cancer Neg Hx    Colon polyps Neg Hx    Esophageal cancer Neg Hx    Rectal cancer Neg Hx    Stomach cancer Neg Hx    Pancreatic cancer Neg Hx     Social History   Tobacco Use   Smoking status: Former    Current packs/day: 0.00    Average packs/day: 0.5 packs/day for 3.0 years (1.5 ttl pk-yrs)    Types: Cigarettes    Start date: 04/03/1999    Quit date: 04/02/2002    Years since quitting: 21.7   Smokeless tobacco: Never  Vaping Use   Vaping status: Never Used  Substance Use Topics   Alcohol use: Yes    Comment: social   Drug use: No     Review of Systems:    Constitutional: No weight loss, fever, chills Cardiovascular: No chest pain Respiratory: No SOB Gastrointestinal: See HPI and otherwise negative Hematologic: No bleeding or bruising   Physical Exam:  Vital signs: BP 118/88   Pulse (!) 103   Ht 5' 5 (1.651 m)   Wt 241 lb (109.3 kg)   LMP 12/26/2023 (Approximate)   SpO2 98%   BMI 40.10 kg/m    Constitutional: Pleasant, obese female in NAD, alert and cooperative Head:  Normocephalic and atraumatic.  Eyes: No scleral icterus.  Respiratory: Respirations even and unlabored. Lungs clear to auscultation bilaterally.  No  wheezes, crackles, or rhonchi.  Cardiovascular:  Regular rate and rhythm. No murmurs. No peripheral edema. Gastrointestinal:  Soft, nondistended, nontender. No rebound or guarding. Normal bowel sounds. No appreciable masses or hepatomegaly. Rectal:  Not performed.  Neurologic:  Alert and oriented x4;  grossly normal neurologically.  Skin:   Dry and intact without significant lesions or rashes. Psychiatric: Oriented to person, place and time. Demonstrates good judgement and reason without abnormal affect or behaviors.   RELEVANT LABS AND IMAGING: CBC    Component Value Date/Time   WBC 5.9 07/05/2017 1150   RBC 4.85 07/05/2017 1150   HGB 14.6 07/05/2017 1150   HCT 42.6 07/05/2017 1150   PLT 360.0 07/05/2017 1150   MCV 88.0 07/05/2017 1150   MCHC 34.3 07/05/2017 1150   RDW 12.9 07/05/2017 1150   LYMPHSABS 1.5 07/05/2017 1150   MONOABS 0.7 07/05/2017 1150   EOSABS 0.1 07/05/2017 1150   BASOSABS 0.1 07/05/2017 1150    CMP     Component Value Date/Time   NA 140 07/05/2017 1150   K 3.8 07/05/2017 1150   CL 105 07/05/2017 1150   CO2 28 07/05/2017 1150   GLUCOSE 91 07/05/2017 1150   BUN 9 07/05/2017 1150   CREATININE 0.55 07/05/2017 1150   CALCIUM 9.2 07/05/2017 1150   PROT 6.8 07/05/2017 1150   ALBUMIN 4.0 07/05/2017 1150   AST 17 07/05/2017 1150   ALT 23 07/05/2017 1150   ALKPHOS 56 07/05/2017 1150   BILITOT 0.4 07/05/2017 1150     Assessment/Plan:   Irritable bowel syndrome with alternating diarrhea and constipation Patient seen today for medication refill/follow-up of IBS.  Currently symptoms well-controlled with dicyclomine  up to twice daily and fiber supplementation.  Has occasional loose stools about once a week, rare watery diarrhea, no rectal bleeding.  States that since starting dicyclomine  and Citrucel really has not had much constipation at all.  She did try cutting back to dicyclomine  once a day and was able to do this successfully for a while, but  this year has  had some life stressors and felt she needed to go back to twice daily.  This has been effective.  Discussed trying to maintain lowest effective dose of dicyclomine .  Advised trial of IBgard, as well as low FODMAP diet to see if we can get her back down to once daily dicyclomine .  On Pepcid  due to NSAID use.  - Continue dicyclomine  10 mg up to twice daily as needed - Continue Pepcid  20 mg daily - Trial of Ibgard - Trial of low FODMAP diet - Follow-up in 1 year or sooner if needed   Camie Furbish, PA-C [facility] 01/07/2024, 8:25 AM  Patient Care Team: Kathlyne Romualdo Capers, NP as PCP - General (Nurse Practitioner) Dolphus Reiter, MD as Consulting Physician (Rheumatology) Latisha Medford, MD as Consulting Physician (Obstetrics and Gynecology)

## 2024-01-11 ENCOUNTER — Other Ambulatory Visit: Payer: Self-pay | Admitting: Gastroenterology

## 2024-02-05 ENCOUNTER — Ambulatory Visit: Admitting: Internal Medicine

## 2024-02-05 ENCOUNTER — Encounter: Payer: Self-pay | Admitting: Internal Medicine

## 2024-02-05 VITALS — BP 122/72 | HR 101 | Ht 65.0 in | Wt 242.0 lb

## 2024-02-05 DIAGNOSIS — R5383 Other fatigue: Secondary | ICD-10-CM | POA: Diagnosis not present

## 2024-02-05 DIAGNOSIS — Z87891 Personal history of nicotine dependence: Secondary | ICD-10-CM

## 2024-02-05 DIAGNOSIS — G4733 Obstructive sleep apnea (adult) (pediatric): Secondary | ICD-10-CM

## 2024-02-05 DIAGNOSIS — R0602 Shortness of breath: Secondary | ICD-10-CM

## 2024-02-05 DIAGNOSIS — M0689 Other specified rheumatoid arthritis, multiple sites: Secondary | ICD-10-CM

## 2024-02-05 DIAGNOSIS — M069 Rheumatoid arthritis, unspecified: Secondary | ICD-10-CM | POA: Diagnosis not present

## 2024-02-05 NOTE — Patient Instructions (Addendum)
 It was a pleasure to see you today!  Please schedule follow up with Dr. Theodoro in 6 months.  If my schedule is not open yet, we will contact you with a reminder closer to that time. Please call [phone] if you haven't heard from us  a month before, and always call us  sooner if issues or concerns arise. You can also send us  a message through MyChart, but but aware that this is not to be used for urgent issues and it may take up to 5-7 days to receive a reply. Please be aware that you will likely be able to view your results before I have a chance to respond to them. Please give us  5 business days to respond to any non-urgent results.    Continue Auto CPAP 5-15 cm H20 with nasal pillows   Since you had some shortness of breath which is new we will repeat a PFT. If this is abnormal or changed we might consider a CT scan of your chest to make sure no autoimmune involvement in the lungs. Based on your physical exam, my suspicions for this are lower.

## 2024-02-05 NOTE — Progress Notes (Signed)
 Joann Harrison    [id number]    [birth year]  Primary Care Physician:Dragnev, Romualdo Capers, NP Date of Appointment: 02/05/2024 Established Patient Visit  Chief complaint:   Chief Complaint  Patient presents with   Medical Management of Chronic Issues    Pt states everything has been well      HPI: Joann Harrison is a 43 y.o. 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   Interval Updates: Here for CPAP follow up.  Does have some dyspnea which is unpredictable. No wheezing or coughing.    Wears CPAP faithfully, 100% adherence auto cpap 5-15 cm H20 with nasal pillows.   Feels that her nasal pillows fall out and she adjusts them frequently. Tried a chin strap and this irritated her skin.   She moves around a lot in her sleep which she attributes to her RA.   RA symptoms mostly controlled.  Feels joint symptoms are well controlled. Also struggles with fatigue and jaw pain. She is seeing a dentist and has a night guard. Has had xrays done and is concerned for arthritis - following up with Dr. Mai next week.   I have reviewed the patient's family social and past medical history and updated as appropriate.   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 Miquel in [place]   Sleep apnea, obstructive    uses CPAP at night   TMJ (temporomandibular joint syndrome)     Past Surgical History:  Procedure Laterality Date   HERNIA REPAIR  1983    Family History  Problem Relation Age of Onset   Asthma Mother    Diabetes Other        other relative    Arthritis Other        grandparent   Hyperlipidemia Other        parent & grandparent   Stroke Other        grandparent   Colon cancer Neg Hx    Colon polyps Neg Hx    Esophageal cancer Neg Hx    Rectal cancer Neg Hx    Stomach cancer Neg Hx    Pancreatic cancer Neg Hx     Social History   Occupational History   Not on file  Tobacco Use   Smoking status: Former    Current packs/day: 0.00    Average packs/day: 0.5 packs/day for 3.0 years (1.5 ttl pk-yrs)    Types: Cigarettes    Start date: 04/03/1999    Quit date: 04/02/2002    Years since quitting: 21.8   Smokeless tobacco: Never  Vaping Use   Vaping status: Never Used  Substance and Sexual Activity   Alcohol use: Yes    Comment: social   Drug use: No   Sexual activity: Not on file     Physical Exam: Blood pressure 122/72, pulse (!) 101, height 5' 5 (1.651 m), weight 242 lb (109.8 kg), last menstrual period 12/26/2023, SpO2  100%.  Gen:      No distress, obese Lungs:    ctab no wheeze CV:        RRR no mrg, no edema   Data Reviewed: Imaging: I have personally reviewed the chest xray 2010 is normal.   PFTs:     Latest Ref Rng & Units 06/06/2021    2:55 PM  PFT Results  FVC-Pre L 3.00   FVC-Predicted Pre % 81   FVC-Post L 2.87   FVC-Predicted Post % 77   Pre FEV1/FVC % % 87   Post FEV1/FCV % % 91   FEV1-Pre L 2.63   FEV1-Predicted Pre % 86   FEV1-Post L 2.62   DLCO uncorrected ml/min/mmHg 23.13   DLCO UNC% % 105   DLCO corrected ml/min/mmHg 23.13   DLCO COR %Predicted % 105   DLVA Predicted % 129   TLC L 4.28   TLC % Predicted % 84   RV % Predicted % 81    I have personally reviewed the patient's PFTs and normal pulmonary function  Labs: Lab Results  Component Value Date   NA 140 07/05/2017   K 3.8 07/05/2017   CO2 28 07/05/2017   GLUCOSE 91 07/05/2017   BUN 9 07/05/2017   CREATININE 0.55 07/05/2017   CALCIUM 9.2 07/05/2017   GFR 133.10 07/05/2017   Lab Results  Component Value Date    WBC 5.9 07/05/2017   HGB 14.6 07/05/2017   HCT 42.6 07/05/2017   MCV 88.0 07/05/2017   PLT 360.0 07/05/2017    Immunization status: Immunization History  Administered Date(s) Administered   Influenza Inj Mdck Quad Pf 01/22/2017   Influenza Whole 02/23/2009   Influenza, Seasonal, Injecte, Preservative Fre 01/13/2020   Influenza-Unspecified 01/22/2017, 01/13/2021   Td 04/03/2007   Unspecified SARS-COV-2 Vaccination 06/06/2019, 06/27/2019, 03/03/2020, 09/02/2020    External Records Personally Reviewed: cpap download.   Assessment:  OSA on CPAP - controlled Rheumatoid Arthritis with fatigue and dyspnea  Plan/Recommendations:  Continue Auto CPAP 5-15 cm H20 with nasal pillows  Since you had some shortness of breath which is new we will repeat a PFT. If this is abnormal or changed we might consider a CT scan of your chest to make sure no autoimmune involvement in the lungs. Based on your physical exam, my suspicions for this are lower.    She has excellent adherence and does benefit from treatment.  Discussed spending time outside during daylight hours to help with circadian rhythm cycling.   Return to Care: Return in about 6 months (around 08/04/2024).   Verdon Gore, MD Pulmonary and Critical Care Medicine [facility] Office:[phone]

## 2024-02-12 ENCOUNTER — Inpatient Hospital Stay (HOSPITAL_COMMUNITY): Admission: RE | Admit: 2024-02-12 | Source: Ambulatory Visit

## 2024-02-17 ENCOUNTER — Encounter

## 2024-02-19 ENCOUNTER — Encounter: Payer: Self-pay | Admitting: Internal Medicine

## 2024-03-04 ENCOUNTER — Other Ambulatory Visit: Payer: Self-pay | Admitting: Obstetrics and Gynecology

## 2024-03-04 DIAGNOSIS — R928 Other abnormal and inconclusive findings on diagnostic imaging of breast: Secondary | ICD-10-CM

## 2024-03-06 ENCOUNTER — Other Ambulatory Visit

## 2024-03-06 ENCOUNTER — Inpatient Hospital Stay
Admission: RE | Admit: 2024-03-06 | Discharge: 2024-03-06 | Attending: Obstetrics and Gynecology | Admitting: Obstetrics and Gynecology

## 2024-03-06 DIAGNOSIS — R928 Other abnormal and inconclusive findings on diagnostic imaging of breast: Secondary | ICD-10-CM

## 2024-03-12 ENCOUNTER — Ambulatory Visit (INDEPENDENT_AMBULATORY_CARE_PROVIDER_SITE_OTHER)

## 2024-03-12 DIAGNOSIS — M0689 Other specified rheumatoid arthritis, multiple sites: Secondary | ICD-10-CM

## 2024-03-12 LAB — PULMONARY FUNCTION TEST
DL/VA % pred: 129 %
DL/VA: 5.71 ml/min/mmHg/L
DLCO unc % pred: 103 %
DLCO unc: 22.38 ml/min/mmHg
FEF 25-75 Pre: 3.61 L/s
FEF2575-%Pred-Pre: 118 %
FEV1-%Pred-Pre: 82 %
FEV1-Pre: 2.45 L
FEV1FVC-%Pred-Pre: 109 %
FEV6-%Pred-Pre: 75 %
FEV6-Pre: 2.73 L
FEV6FVC-%Pred-Pre: 101 %
FVC-%Pred-Pre: 74 %
FVC-Pre: 2.73 L
Pre FEV1/FVC ratio: 90 %
Pre FEV6/FVC Ratio: 100 %

## 2024-03-12 NOTE — Patient Instructions (Signed)
Spiro/DLCO performed today. 

## 2024-03-12 NOTE — Progress Notes (Signed)
Spiro/DLCO performed today. 

## 2024-03-18 ENCOUNTER — Ambulatory Visit: Payer: Self-pay | Admitting: Internal Medicine

## 2024-03-27 ENCOUNTER — Other Ambulatory Visit: Payer: Self-pay | Admitting: Gastroenterology

## 2024-04-23 ENCOUNTER — Other Ambulatory Visit: Payer: Self-pay | Admitting: Gastroenterology
